# Patient Record
Sex: Female | Born: 1955
Health system: Southern US, Community
[De-identification: ages and names within clinical notes are randomized; demographics above are authoritative.]

## PROBLEM LIST (undated history)

## (undated) DIAGNOSIS — K219 Gastro-esophageal reflux disease without esophagitis: Secondary | ICD-10-CM

## (undated) DIAGNOSIS — F32A Depression, unspecified: Secondary | ICD-10-CM

## (undated) DIAGNOSIS — E785 Hyperlipidemia, unspecified: Secondary | ICD-10-CM

## (undated) DIAGNOSIS — F419 Anxiety disorder, unspecified: Secondary | ICD-10-CM

## (undated) DIAGNOSIS — T7840XA Allergy, unspecified, initial encounter: Secondary | ICD-10-CM

## (undated) DIAGNOSIS — M1990 Unspecified osteoarthritis, unspecified site: Secondary | ICD-10-CM

## (undated) DIAGNOSIS — I10 Essential (primary) hypertension: Secondary | ICD-10-CM

## (undated) DIAGNOSIS — F329 Major depressive disorder, single episode, unspecified: Secondary | ICD-10-CM

## (undated) HISTORY — DX: Gastro-esophageal reflux disease without esophagitis: K21.9

## (undated) HISTORY — DX: Anxiety disorder, unspecified: F41.9

## (undated) HISTORY — DX: Depression, unspecified: F32.A

## (undated) HISTORY — DX: Major depressive disorder, single episode, unspecified: F32.9

## (undated) HISTORY — DX: Allergy, unspecified, initial encounter: T78.40XA

---

## 1997-01-28 HISTORY — PX: ABDOMINAL HYSTERECTOMY: SHX81

## 2004-08-27 ENCOUNTER — Ambulatory Visit (HOSPITAL_COMMUNITY): Admission: RE | Admit: 2004-08-27 | Discharge: 2004-08-27 | Payer: Self-pay | Admitting: Family Medicine

## 2008-01-19 ENCOUNTER — Ambulatory Visit (HOSPITAL_COMMUNITY): Admission: RE | Admit: 2008-01-19 | Discharge: 2008-01-19 | Payer: Self-pay | Admitting: Internal Medicine

## 2009-02-21 ENCOUNTER — Ambulatory Visit (HOSPITAL_COMMUNITY): Admission: RE | Admit: 2009-02-21 | Discharge: 2009-02-21 | Payer: Self-pay | Admitting: Internal Medicine

## 2010-02-18 ENCOUNTER — Encounter: Payer: Self-pay | Admitting: Internal Medicine

## 2010-11-22 ENCOUNTER — Other Ambulatory Visit (HOSPITAL_COMMUNITY): Payer: Self-pay | Admitting: Family Medicine

## 2010-11-22 DIAGNOSIS — Z139 Encounter for screening, unspecified: Secondary | ICD-10-CM

## 2010-11-27 ENCOUNTER — Ambulatory Visit (HOSPITAL_COMMUNITY): Payer: Self-pay

## 2010-12-05 ENCOUNTER — Other Ambulatory Visit (HOSPITAL_COMMUNITY): Payer: Self-pay | Admitting: Family Medicine

## 2010-12-05 DIAGNOSIS — Z1231 Encounter for screening mammogram for malignant neoplasm of breast: Secondary | ICD-10-CM

## 2010-12-06 ENCOUNTER — Ambulatory Visit (HOSPITAL_COMMUNITY)
Admission: RE | Admit: 2010-12-06 | Discharge: 2010-12-06 | Disposition: A | Discharge status: Home / Self Care | Payer: Self-pay | Source: Ambulatory Visit | Attending: Family Medicine | Admitting: Family Medicine

## 2010-12-06 DIAGNOSIS — Z1231 Encounter for screening mammogram for malignant neoplasm of breast: Secondary | ICD-10-CM

## 2012-01-01 ENCOUNTER — Other Ambulatory Visit (HOSPITAL_COMMUNITY): Payer: Self-pay | Admitting: Family Medicine

## 2012-01-08 ENCOUNTER — Other Ambulatory Visit (HOSPITAL_COMMUNITY): Payer: Self-pay | Admitting: Family Medicine

## 2012-01-08 DIAGNOSIS — Z1231 Encounter for screening mammogram for malignant neoplasm of breast: Secondary | ICD-10-CM

## 2012-01-10 ENCOUNTER — Ambulatory Visit (HOSPITAL_COMMUNITY)
Admission: RE | Admit: 2012-01-10 | Discharge: 2012-01-10 | Disposition: A | Discharge status: Home / Self Care | Payer: Self-pay | Source: Ambulatory Visit | Attending: Family Medicine | Admitting: Family Medicine

## 2012-01-10 DIAGNOSIS — Z1231 Encounter for screening mammogram for malignant neoplasm of breast: Secondary | ICD-10-CM

## 2012-11-27 ENCOUNTER — Encounter (HOSPITAL_COMMUNITY): Payer: Self-pay | Admitting: Emergency Medicine

## 2012-11-27 ENCOUNTER — Emergency Department (HOSPITAL_COMMUNITY)
Admission: EM | Admit: 2012-11-27 | Discharge: 2012-11-28 | Disposition: A | Discharge status: Home / Self Care | Payer: Disability Insurance | Attending: Emergency Medicine | Admitting: Emergency Medicine

## 2012-11-27 DIAGNOSIS — F17200 Nicotine dependence, unspecified, uncomplicated: Secondary | ICD-10-CM | POA: Insufficient documentation

## 2012-11-27 DIAGNOSIS — F3289 Other specified depressive episodes: Secondary | ICD-10-CM | POA: Insufficient documentation

## 2012-11-27 DIAGNOSIS — Z8739 Personal history of other diseases of the musculoskeletal system and connective tissue: Secondary | ICD-10-CM | POA: Insufficient documentation

## 2012-11-27 DIAGNOSIS — Z880 Allergy status to penicillin: Secondary | ICD-10-CM | POA: Insufficient documentation

## 2012-11-27 DIAGNOSIS — I10 Essential (primary) hypertension: Secondary | ICD-10-CM | POA: Insufficient documentation

## 2012-11-27 DIAGNOSIS — E785 Hyperlipidemia, unspecified: Secondary | ICD-10-CM | POA: Insufficient documentation

## 2012-11-27 DIAGNOSIS — F329 Major depressive disorder, single episode, unspecified: Secondary | ICD-10-CM

## 2012-11-27 HISTORY — DX: Hyperlipidemia, unspecified: E78.5

## 2012-11-27 HISTORY — DX: Unspecified osteoarthritis, unspecified site: M19.90

## 2012-11-27 HISTORY — DX: Essential (primary) hypertension: I10

## 2012-11-27 NOTE — ED Notes (Addendum)
Pt brought to department by family.  Per family, pt is "going through some depression and anxiety problems." Pt also reporting chronic pain in left hip and back.  Pt has initial appointment with counselor.  Has not been started on medications.  Next appointment scheduled, but pt cannot remember who it is.

## 2012-11-28 LAB — CBC WITH DIFFERENTIAL/PLATELET
Basophils Absolute: 0 10*3/uL (ref 0.0–0.1)
Basophils Relative: 0 % (ref 0–1)
HCT: 39.1 % (ref 36.0–46.0)
Hemoglobin: 13.3 g/dL (ref 12.0–15.0)
Lymphs Abs: 2.7 10*3/uL (ref 0.7–4.0)
MCH: 28.3 pg (ref 26.0–34.0)
MCHC: 34 g/dL (ref 30.0–36.0)
Platelets: 243 10*3/uL (ref 150–400)
RBC: 4.7 MIL/uL (ref 3.87–5.11)
RDW: 13.4 % (ref 11.5–15.5)
WBC: 8.2 10*3/uL (ref 4.0–10.5)

## 2012-11-28 LAB — BASIC METABOLIC PANEL
BUN: 9 mg/dL (ref 6–23)
Creatinine, Ser: 1.01 mg/dL (ref 0.50–1.10)
GFR calc non Af Amer: 61 mL/min — ABNORMAL LOW (ref >90)
Glucose, Bld: 107 mg/dL — ABNORMAL HIGH (ref 70–99)
Sodium: 139 mEq/L (ref 135–145)

## 2012-11-28 LAB — RAPID URINE DRUG SCREEN, HOSP PERFORMED
Amphetamines: NOT DETECTED
Benzodiazepines: NOT DETECTED
Opiates: NOT DETECTED

## 2012-11-28 LAB — ETHANOL: Alcohol, Ethyl (B): 146 mg/dL — ABNORMAL HIGH (ref 0–11)

## 2012-11-28 MED ORDER — NAPROXEN 500 MG PO TABS: 500.0000 mg | ORAL_TABLET | Freq: Two times a day (BID) | ORAL | Status: DC

## 2012-11-28 MED ORDER — PREDNISONE 20 MG PO TABS: ORAL_TABLET | ORAL | Status: DC

## 2012-11-28 NOTE — ED Notes (Signed)
TTS consult completed - during evaluation, patient was very soft spoken and tearful.

## 2012-11-28 NOTE — ED Provider Notes (Addendum)
CSN: [id number]     Arrival date & time 11/27/12  2310 History   First MD Initiated Contact with Patient 11/28/12 0006     No chief complaint on file.  (Consider location/radiation/quality/duration/timing/severity/associated sxs/prior Treatment) HPI... long-standing depression for several years after her divorce. Patient says she is tired of living but not actively suicidal. She takes care of an impaired relative. Some drinking. No Street drugs. She just started some counseling this week. Severity is moderate. Nothing makes symptoms better or worse.   Past Medical History  Diagnosis Date  . Hypertension   . Hyperlipidemia   . Arthritis    Past Surgical History  Procedure Laterality Date  . Abdominal hysterectomy     History reviewed. No pertinent family history. History  Substance Use Topics  . Smoking status: Current Every Day Smoker -- 0.50 packs/day    Types: Cigarettes  . Smokeless tobacco: Not on file  . Alcohol Use: Yes     Comment: occasional   OB History   Grav Para Term Preterm Abortions TAB SAB Ect Mult Living                 Review of Systems  All other systems reviewed and are negative.    Allergies  Penicillins  Home Medications   Current Outpatient Rx  Name  Route  Sig  Dispense  Refill  . UNKNOWN TO PATIENT               . UNKNOWN TO PATIENT                BP 120/72  Pulse 91  Temp(Src) 98.3 F (36.8 C) (Oral)  Resp 16  Ht 4\' 10"  (1.473 m)  Wt 186 lb (84.369 kg)  BMI 38.88 kg/m2  SpO2 97% Physical Exam  Nursing note and vitals reviewed. Constitutional: She is oriented to person, place, and time. She appears well-developed and well-nourished.  HENT:  Head: Normocephalic and atraumatic.  Eyes: Conjunctivae and EOM are normal. Pupils are equal, round, and reactive to light.  Neck: Normal range of motion. Neck supple.  Cardiovascular: Normal rate, regular rhythm and normal heart sounds.   Pulmonary/Chest: Effort normal and breath  sounds normal.  Abdominal: Soft. Bowel sounds are normal.  Musculoskeletal: Normal range of motion.  Neurological: She is alert and oriented to person, place, and time.  Skin: Skin is warm and dry.  Psychiatric:  Flat affect, depressed    ED Course  Procedures (including critical care time) Labs Review Labs Reviewed  BASIC METABOLIC PANEL - Abnormal; Notable for the following:    Glucose, Bld 107 (*)    GFR calc non Af Amer 61 (*)    GFR calc Af Amer 71 (*)    All other components within normal limits  ETHANOL - Abnormal; Notable for the following:    Alcohol, Ethyl (B) 146 (*)    All other components within normal limits  URINE RAPID DRUG SCREEN (HOSP PERFORMED) - Abnormal; Notable for the following:    Tetrahydrocannabinol POSITIVE (*)    All other components within normal limits  CBC WITH DIFFERENTIAL   Imaging Review No results found.  EKG Interpretation   None       MDM   1. Depression    Will obtain behavioral health consultation  Addendum at 05 35..... date of consultation obtained. No suicidal or homicidal ideation. Patient has a primary care therapist. She will followup with same  Donnetta Hutching, MD 11/28/12 0419  Donnetta Hutching,  MD 11/28/12 1610
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
## 2012-11-28 NOTE — ED Notes (Signed)
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  States this and pain in her hip and back has increased her stress level and she is at the point she feels she would be better to just go to sleep and not wake up.   Remains quietly tearful, still c/o being cold.  Requested some coffee - provided with decaf.  Cousin (female) sitting in room with her.

## 2012-12-11 ENCOUNTER — Other Ambulatory Visit (HOSPITAL_COMMUNITY): Payer: Self-pay | Admitting: Family Medicine

## 2012-12-11 DIAGNOSIS — Z1231 Encounter for screening mammogram for malignant neoplasm of breast: Secondary | ICD-10-CM

## 2013-01-12 ENCOUNTER — Ambulatory Visit (HOSPITAL_COMMUNITY)
Admission: RE | Admit: 2013-01-12 | Discharge: 2013-01-12 | Disposition: A | Discharge status: Home / Self Care | Payer: Disability Insurance | Source: Ambulatory Visit | Attending: Family Medicine | Admitting: Family Medicine

## 2013-01-12 DIAGNOSIS — Z1231 Encounter for screening mammogram for malignant neoplasm of breast: Secondary | ICD-10-CM

## 2013-02-18 ENCOUNTER — Other Ambulatory Visit (HOSPITAL_COMMUNITY): Payer: Self-pay | Admitting: Family Medicine

## 2013-02-18 ENCOUNTER — Ambulatory Visit (HOSPITAL_COMMUNITY)
Admission: RE | Admit: 2013-02-18 | Discharge: 2013-02-18 | Disposition: A | Discharge status: Home / Self Care | Payer: Disability Insurance | Source: Ambulatory Visit | Attending: Family Medicine | Admitting: Family Medicine

## 2013-02-18 DIAGNOSIS — M545 Low back pain, unspecified: Secondary | ICD-10-CM

## 2013-04-12 ENCOUNTER — Other Ambulatory Visit (HOSPITAL_COMMUNITY): Payer: Self-pay | Admitting: Obstetrics and Gynecology

## 2013-04-12 ENCOUNTER — Ambulatory Visit (HOSPITAL_COMMUNITY)
Admission: RE | Admit: 2013-04-12 | Discharge: 2013-04-12 | Disposition: A | Discharge status: Home / Self Care | Payer: Disability Insurance | Source: Ambulatory Visit | Attending: Obstetrics and Gynecology | Admitting: Obstetrics and Gynecology

## 2013-04-12 DIAGNOSIS — M1990 Unspecified osteoarthritis, unspecified site: Secondary | ICD-10-CM

## 2013-04-12 DIAGNOSIS — M79605 Pain in left leg: Secondary | ICD-10-CM

## 2013-04-12 DIAGNOSIS — M259 Joint disorder, unspecified: Secondary | ICD-10-CM | POA: Insufficient documentation

## 2013-04-12 DIAGNOSIS — M25569 Pain in unspecified knee: Secondary | ICD-10-CM | POA: Insufficient documentation

## 2014-05-05 ENCOUNTER — Encounter (HOSPITAL_COMMUNITY): Payer: Self-pay | Admitting: *Deleted

## 2014-05-05 ENCOUNTER — Emergency Department (HOSPITAL_COMMUNITY): Payer: Self-pay

## 2014-05-05 ENCOUNTER — Emergency Department (HOSPITAL_COMMUNITY)
Admission: EM | Admit: 2014-05-05 | Discharge: 2014-05-06 | Disposition: A | Discharge status: Home / Self Care | Payer: Self-pay | Attending: Emergency Medicine | Admitting: Emergency Medicine

## 2014-05-05 DIAGNOSIS — S93402A Sprain of unspecified ligament of left ankle, initial encounter: Secondary | ICD-10-CM | POA: Insufficient documentation

## 2014-05-05 DIAGNOSIS — Z8739 Personal history of other diseases of the musculoskeletal system and connective tissue: Secondary | ICD-10-CM | POA: Insufficient documentation

## 2014-05-05 DIAGNOSIS — Z720 Tobacco use: Secondary | ICD-10-CM | POA: Insufficient documentation

## 2014-05-05 DIAGNOSIS — Y92009 Unspecified place in unspecified non-institutional (private) residence as the place of occurrence of the external cause: Secondary | ICD-10-CM

## 2014-05-05 DIAGNOSIS — S8001XA Contusion of right knee, initial encounter: Secondary | ICD-10-CM | POA: Insufficient documentation

## 2014-05-05 DIAGNOSIS — Z791 Long term (current) use of non-steroidal anti-inflammatories (NSAID): Secondary | ICD-10-CM | POA: Insufficient documentation

## 2014-05-05 DIAGNOSIS — Z7952 Long term (current) use of systemic steroids: Secondary | ICD-10-CM | POA: Insufficient documentation

## 2014-05-05 DIAGNOSIS — Y9301 Activity, walking, marching and hiking: Secondary | ICD-10-CM | POA: Insufficient documentation

## 2014-05-05 DIAGNOSIS — W19XXXA Unspecified fall, initial encounter: Secondary | ICD-10-CM

## 2014-05-05 DIAGNOSIS — Y9289 Other specified places as the place of occurrence of the external cause: Secondary | ICD-10-CM | POA: Insufficient documentation

## 2014-05-05 DIAGNOSIS — Y998 Other external cause status: Secondary | ICD-10-CM | POA: Insufficient documentation

## 2014-05-05 DIAGNOSIS — W1830XA Fall on same level, unspecified, initial encounter: Secondary | ICD-10-CM | POA: Insufficient documentation

## 2014-05-05 DIAGNOSIS — I10 Essential (primary) hypertension: Secondary | ICD-10-CM | POA: Insufficient documentation

## 2014-05-05 DIAGNOSIS — Z8639 Personal history of other endocrine, nutritional and metabolic disease: Secondary | ICD-10-CM | POA: Insufficient documentation

## 2014-05-05 DIAGNOSIS — F419 Anxiety disorder, unspecified: Secondary | ICD-10-CM | POA: Insufficient documentation

## 2014-05-05 DIAGNOSIS — Z880 Allergy status to penicillin: Secondary | ICD-10-CM | POA: Insufficient documentation

## 2014-05-05 MED ORDER — IBUPROFEN 800 MG PO TABS
800.0000 mg | ORAL_TABLET | Freq: Once | ORAL | Status: AC
  Administered 2014-05-06: 800 mg via ORAL
  Filled 2014-05-05: qty 1

## 2014-05-05 NOTE — ED Provider Notes (Signed)
CSN: [id number]     Arrival date & time 05/05/14  2152 History   First MD Initiated Contact with Patient 05/05/14 2250     Chief Complaint  Patient presents with  . Extremity Pain     (Consider location/radiation/quality/duration/timing/severity/associated sxs/prior Treatment) HPI   Victoria Graves is a 59 y.o. female who presents to the Emergency Department complaining of left ankle pain and right knee pain.  Se states she was walking to her car and twisted her left ankle which caused her to fall onto her right knee.  She reports pain and swelling to the anterior left ankle and entire right knee.  Pain is worse with weight bearing.  She reports the injury occurred approximately two hours ago.  She has not tried any therapies or medications prior to arrival.  She reports feeling anxious and states that she takes "nerve pills" which she has not taken tonight.  She denies numbness, weakness of the lower extremities, neck or back pain, head injury or LOC.     Past Medical History  Diagnosis Date  . Hypertension   . Hyperlipidemia   . Arthritis    Past Surgical History  Procedure Laterality Date  . Abdominal hysterectomy     History reviewed. No pertinent family history. History  Substance Use Topics  . Smoking status: Current Every Day Smoker -- 0.50 packs/day    Types: Cigarettes  . Smokeless tobacco: Not on file  . Alcohol Use: Yes     Comment: occasional   OB History    No data available     Review of Systems  Constitutional: Negative for fever and chills.  Gastrointestinal: Negative for nausea and vomiting.  Genitourinary: Negative for dysuria and difficulty urinating.  Musculoskeletal: Positive for joint swelling and arthralgias (left ankle and right knee pain). Negative for back pain and neck pain.  Skin: Negative for color change and wound.  Neurological: Negative for dizziness, weakness and numbness.  Psychiatric/Behavioral: Negative for confusion. The patient is  nervous/anxious.   All other systems reviewed and are negative.     Allergies  Penicillins  Home Medications   Prior to Admission medications   Medication Sig Start Date End Date Taking? Authorizing Provider  naproxen (NAPROSYN) 500 MG tablet Take 1 tablet (500 mg total) by mouth 2 (two) times daily. 11/28/12   Nat Christen, MD  predniSONE (DELTASONE) 20 MG tablet 3 tabs po day one, then 2 po daily x 4 days 11/28/12   Nat Christen, MD  UNKNOWN TO PATIENT     Historical Provider, MD  UNKNOWN TO PATIENT     Historical Provider, MD   BP 143/58 mmHg  Pulse 83  Temp(Src) 99 F (37.2 C) (Oral)  Ht 4\' 10"  (1.473 m)  Wt 151 lb (68.493 kg)  BMI 31.57 kg/m2  SpO2 99% Physical Exam  Constitutional: She is oriented to person, place, and time. She appears well-developed and well-nourished. No distress.  I do not smell ETOH  HENT:  Head: Normocephalic and atraumatic.  Neck: Normal range of motion. Neck supple. No spinous process tenderness and no muscular tenderness present.  Cardiovascular: Normal rate, regular rhythm, normal heart sounds and intact distal pulses.   No murmur heard. Pulmonary/Chest: Effort normal and breath sounds normal. No respiratory distress.  Musculoskeletal: She exhibits edema and tenderness.  Mild edema and tenderness of the dorsal left ankle.  ROM is preserved.  DP pulse is brisk,distal sensation intact.  No erythema, abrasion, bruising or bony deformity.  No proximal  tenderness. Pt also has diffuse tenderness over the right patella.  Mild patellar crepitus.  No bony deformity.  No effusion or edema  Neurological: She is alert and oriented to person, place, and time. She exhibits normal muscle tone. Coordination normal.  Skin: Skin is warm and dry.  Psychiatric:  Patient is tearful, anxious appearing.    Nursing note and vitals reviewed.   ED Course  Procedures (including critical care time) Labs Review Labs Reviewed - No data to display  Imaging Review Dg  Ankle Complete Left  05/05/2014   CLINICAL DATA:  Patient fell today landing on left ankle and right knee. Pain.  EXAM: LEFT ANKLE COMPLETE - 3+ VIEW  COMPARISON:  None.  FINDINGS: Old ununited ossicle over the cuboidal bone. Prominent plantar calcaneal spur. No evidence of acute fracture or subluxation. No focal bone lesion or bone destruction. Bone cortex and trabecular architecture appear intact. No radiopaque soft tissue foreign bodies.  IMPRESSION: Negative.   Electronically Signed   By: Lucienne Capers M.D.   On: 05/05/2014 23:18   Dg Knee Complete 4 Views Right  05/05/2014   CLINICAL DATA:  Patient fell today, landing on the left ankle and right knee. Pain.  EXAM: RIGHT KNEE - COMPLETE 4+ VIEW  COMPARISON:  None.  FINDINGS: There is no evidence of fracture, dislocation, or joint effusion. There is no evidence of arthropathy or other focal bone abnormality. Soft tissues are unremarkable.  IMPRESSION: Negative.   Electronically Signed   By: Lucienne Capers M.D.   On: 05/05/2014 23:08     EKG Interpretation None      MDM   Final diagnoses:  Fall at home   Pt is anxious appearing.  Family members also at bedside.  XR results reviewed and discussed.  Likely sprains.  Patient agrees to elevate, ice and close orthopedic f/u in one week if needed.    ACE wrap and ASO applied, pain improved, remains NV intact.    Pt reviewed on the [place] narcotics database.  No recent Rx filed.  Pt appears stable for d/c   Victoria Sissi Padia, PA-C 05/06/14 [place], DO 05/06/14 [id number]

## 2014-05-05 NOTE — ED Notes (Signed)
Pt slipped and fell on the grass with her bedroom shoes on. Pt c/o left ankle pain and right knee pain. Etoh?

## 2014-05-06 MED ORDER — HYDROCODONE-ACETAMINOPHEN 5-325 MG PO TABS
ORAL_TABLET | ORAL | Status: DC
Start: 2014-05-06 — End: 2015-11-01

## 2014-05-06 MED ORDER — BACITRACIN ZINC 500 UNIT/GM EX OINT
TOPICAL_OINTMENT | CUTANEOUS | Status: AC
  Filled 2014-05-06: qty 0.9

## 2014-05-06 MED ORDER — IBUPROFEN 800 MG PO TABS: 800.0000 mg | ORAL_TABLET | Freq: Three times a day (TID) | ORAL | Status: DC

## 2014-05-06 NOTE — Discharge Instructions (Signed)
Ankle Sprain  An ankle sprain is an injury to the strong, fibrous tissues (ligaments) that hold your ankle bones together.   HOME CARE   · Put ice on your ankle for 1-2 days or as told by your doctor.  ¨ Put ice in a plastic bag.  ¨ Place a towel between your skin and the bag.  ¨ Leave the ice on for 15-20 minutes at a time, every 2 hours while you are awake.  · Only take medicine as told by your doctor.  · Raise (elevate) your injured ankle above the level of your heart as much as possible for 2-3 days.  · Use crutches if your doctor tells you to. Slowly put your own weight on the affected ankle. Use the crutches until you can walk without pain.  · If you have a plaster splint:  ¨ Do not rest it on anything harder than a pillow for 24 hours.  ¨ Do not put weight on it.  ¨ Do not get it wet.  ¨ Take it off to shower or bathe.  · If given, use an elastic wrap or support stocking for support. Take the wrap off if your toes lose feeling (numb), tingle, or turn cold or blue.  · If you have an air splint:  ¨ Add or let out air to make it comfortable.  ¨ Take it off at night and to shower and bathe.  ¨ Wiggle your toes and move your ankle up and down often while you are wearing it.  GET HELP IF:  · You have rapidly increasing bruising or puffiness (swelling).  · Your toes feel very cold.  · You lose feeling in your foot.  · Your medicine does not help your pain.  GET HELP RIGHT AWAY IF:   · Your toes lose feeling (numb) or turn blue.  · You have severe pain that is increasing.  MAKE SURE YOU:   · Understand these instructions.  · Will watch your condition.  · Will get help right away if you are not doing well or get worse.  Document Released: 07/03/2007 Document Revised: 05/31/2013 Document Reviewed: 07/29/2011  [employer]® Patient Information ©2015 [employer]. This information is not intended to replace advice given to you by your health care provider. Make sure you discuss any questions you have with your health care  provider.

## 2014-05-10 MED FILL — Hydrocodone-Acetaminophen Tab 5-325 MG: ORAL | Qty: 6 | Status: AC

## 2015-11-01 ENCOUNTER — Encounter: Payer: Self-pay | Admitting: Family Medicine

## 2015-11-01 ENCOUNTER — Ambulatory Visit (INDEPENDENT_AMBULATORY_CARE_PROVIDER_SITE_OTHER): Payer: Medicare Other | Admitting: Family Medicine

## 2015-11-01 ENCOUNTER — Encounter (INDEPENDENT_AMBULATORY_CARE_PROVIDER_SITE_OTHER): Payer: Self-pay

## 2015-11-01 VITALS — BP 130/72 | HR 72 | Resp 18 | Ht <= 58 in | Wt 183.0 lb

## 2015-11-01 DIAGNOSIS — M47816 Spondylosis without myelopathy or radiculopathy, lumbar region: Secondary | ICD-10-CM | POA: Insufficient documentation

## 2015-11-01 DIAGNOSIS — E785 Hyperlipidemia, unspecified: Secondary | ICD-10-CM | POA: Insufficient documentation

## 2015-11-01 DIAGNOSIS — I10 Essential (primary) hypertension: Secondary | ICD-10-CM | POA: Insufficient documentation

## 2015-11-01 DIAGNOSIS — Z7689 Persons encountering health services in other specified circumstances: Secondary | ICD-10-CM | POA: Diagnosis not present

## 2015-11-01 DIAGNOSIS — F329 Major depressive disorder, single episode, unspecified: Secondary | ICD-10-CM | POA: Insufficient documentation

## 2015-11-01 DIAGNOSIS — M1612 Unilateral primary osteoarthritis, left hip: Secondary | ICD-10-CM | POA: Insufficient documentation

## 2015-11-01 DIAGNOSIS — Z2821 Immunization not carried out because of patient refusal: Secondary | ICD-10-CM

## 2015-11-01 DIAGNOSIS — E784 Other hyperlipidemia: Secondary | ICD-10-CM

## 2015-11-01 DIAGNOSIS — E7849 Other hyperlipidemia: Secondary | ICD-10-CM

## 2015-11-01 DIAGNOSIS — F32A Depression, unspecified: Secondary | ICD-10-CM | POA: Insufficient documentation

## 2015-11-01 DIAGNOSIS — Z720 Tobacco use: Secondary | ICD-10-CM | POA: Diagnosis not present

## 2015-11-01 MED ORDER — LOSARTAN POTASSIUM-HCTZ 50-12.5 MG PO TABS: 1.0000 | ORAL_TABLET | Freq: Every day | ORAL | 11 refills | Status: DC

## 2015-11-01 MED ORDER — FLUOXETINE HCL 20 MG PO CAPS: 20.0000 mg | ORAL_CAPSULE | Freq: Every day | ORAL | 11 refills | Status: DC

## 2015-11-01 NOTE — Patient Instructions (Signed)
Request records Dr Lorriane Shire  Stop the lisinopril / HCTZ Start the losartan / HCTZ This should take care of the cough  Start the fluoxetine 20 mg daily This will start to work in a couple of weeks Call for problems  Need to come back for a PE in 4 weeks (40 min)  Consider a flu shot

## 2015-11-01 NOTE — Progress Notes (Signed)
Chief Complaint  Patient presents with  . Establish Care    previously with Dr. Cindie Laroche    Patient is here to establish with this practice as her new primary care. Records requested from primary care. She states she has never had a colonoscopy. She states is significant recommended to her. She states she hasn't had a mammogram in several years. Her mammograms in the past for negative. She had a hysterectomy in the late 1990s for fibroids. She refuses immunizations. The importance of flu shots are reviewed and shots are recommended. Patient continues to refuse. She states she's had a tetanus in 3 or 4 years ago. Patient doesn't exercise. She is overweight. She cites arthritis in her back and hip is the main reason she doesn't exercise. Water exercises recommended to her today. She has well-controlled hypertension. She has a cough from her lisinopril. I will switch her today to losartan to see if this can be improved. She denies any complications from hypertension, any heart disease stroke or vascular disease. Patient has hyperlipidemia. She is compliant with pravastatin. She is noncompliant with a low cholesterol diet. Low-cholesterol foods are reviewed with her. Patient has a history of depression. She is previously on fluoxetine. Her previous primary care doctor stop the fluoxetine and told her she didn't need it anymore. She has become more anxious, easily upset, and tearful since going off of this medication and is here today requesting to be put back on her antidepressant. No thoughts of harming self or others. The patient smokes cigarettes.I have discussed the multiple health risks associated with cigarette smoking including, but not limited to, cardiovascular disease, lung disease and cancer.  I have strongly recommended that smoking be stopped.  I have reviewed the various methods of quitting including cold [place], classes, nicotine replacements and prescription medications.  I have  offered assistance in this difficult process.  The patient is not interested in assistance at this time. She thinks that she only smokes one pack a week she is a low risk. We will revisit this at her next visit.   Patient Active Problem List   Diagnosis Date Noted  . Tobacco abuse 11/01/2015  . Depression 11/01/2015  . HTN (hypertension) 11/01/2015  . HLD (hyperlipidemia) 11/01/2015  . Degenerative joint disease (DJD) of lumbar spine 11/01/2015  . Osteoarthritis of left hip 11/01/2015  . Immunization refused 11/01/2015    Outpatient Encounter Prescriptions as of 11/01/2015  Medication Sig  . BLACK COHOSH EXTRACT PO Take by mouth.  . naproxen (NAPROSYN) 500 MG tablet Take 1 tablet (500 mg total) by mouth 2 (two) times daily.  . pravastatin (PRAVACHOL) 40 MG tablet Take 40 mg by mouth daily.  Marland Kitchen FLUoxetine (PROZAC) 20 MG capsule Take 1 capsule (20 mg total) by mouth daily.  Marland Kitchen losartan-hydrochlorothiazide (HYZAAR) 50-12.5 MG tablet Take 1 tablet by mouth daily.   No facility-administered encounter medications on file as of 11/01/2015.     Past Medical History:  Diagnosis Date  . Allergy   . Anxiety   . Arthritis   . Depression   . GERD (gastroesophageal reflux disease)   . Hyperlipidemia   . Hypertension     Past Surgical History:  Procedure Laterality Date  . ABDOMINAL HYSTERECTOMY  1999   fibroid    Social History   Social History  . Marital status: Divorced    Spouse name: N/A  . Number of children: 0  . Years of education: 10   Occupational History  . disability  back and hip   Social History Main Topics  . Smoking status: Current Every Day Smoker    Packs/day: 0.10    Types: Cigarettes  . Smokeless tobacco: Never Used  . Alcohol use Yes     Comment: occasional  . Drug use: No  . Sexual activity: Yes    Birth control/ protection: Surgical   Other Topics Concern  . Not on file   Social History Narrative   Lives with friend and cousin who has mental  disability    Family History  Problem Relation Age of Onset  . Hypertension Mother   . Arthritis Mother   . Heart disease Maternal Grandmother   . Kidney disease Maternal Grandmother     Review of Systems  Constitutional: Negative for chills, fever and weight loss.  HENT: Negative for congestion and hearing loss.   Eyes: Negative for blurred vision and pain.  Respiratory: Positive for cough. Negative for shortness of breath.        Chronic dry cough  Cardiovascular: Negative for chest pain and leg swelling.  Gastrointestinal: Negative for abdominal pain, constipation, diarrhea and heartburn.  Genitourinary: Negative for dysuria and frequency.  Musculoskeletal: Negative for falls, joint pain and myalgias.  Neurological: Negative for dizziness, seizures and headaches.  Psychiatric/Behavioral: Positive for depression. Negative for suicidal ideas. The patient is nervous/anxious. The patient does not have insomnia.     BP 130/72   Pulse 72   Resp 18   Ht 4' 9.5" (1.461 m)   Wt 183 lb (83 kg)   SpO2 98%   BMI 38.92 kg/m   Physical Exam  Constitutional: She is oriented to person, place, and time. She appears well-developed and well-nourished.  HENT:  Head: Normocephalic and atraumatic.  Right Ear: External ear normal.  Left Ear: External ear normal.  Mouth/Throat: Oropharynx is clear and moist.  Teeth in good repair  Eyes: Conjunctivae are normal. Pupils are equal, round, and reactive to light.  Due for eye exam  Neck: Normal range of motion. Neck supple. No thyromegaly present.  Cardiovascular: Normal rate, regular rhythm and normal heart sounds.   Pulmonary/Chest: Effort normal and breath sounds normal. No respiratory distress.  Abdominal: Soft. Bowel sounds are normal.  Musculoskeletal: Normal range of motion. She exhibits no edema.  Lymphadenopathy:    She has no cervical adenopathy.  Neurological: She is alert and oriented to person, place, and time. She has normal  reflexes.  Gait mildly antalgic. Valgus knees  Skin: Skin is warm and dry.  Psychiatric: She has a normal mood and affect. Her behavior is normal. Thought content normal.  Nursing note and vitals reviewed.  ASSESSMENT/PLAN:   1. Tobacco abuse Discussed quitting  2. Essential hypertension Change lisinopril to losartan  3. Other hyperlipidemia Continue pravastatin. Obtain old records for recent lipid testing  4. Primary osteoarthritis of left hip Loss and water exercises recommended  5. Encounter to establish care with new doctor   6. Immunization refused Recommended flu shot   Patient Instructions  Request records Dr Lorriane Shire  Stop the lisinopril / HCTZ Start the losartan / HCTZ This should take care of the cough  Start the fluoxetine 20 mg daily This will start to work in a couple of weeks Call for problems  Need to come back for a PE in 4 weeks (40 min)  Consider a flu shot      Raylene Everts, MD

## 2015-11-29 ENCOUNTER — Encounter: Payer: Self-pay | Admitting: Family Medicine

## 2015-11-29 ENCOUNTER — Telehealth: Payer: Self-pay | Admitting: Family Medicine

## 2015-11-29 ENCOUNTER — Ambulatory Visit (INDEPENDENT_AMBULATORY_CARE_PROVIDER_SITE_OTHER): Payer: Medicare Other | Admitting: Family Medicine

## 2015-11-29 VITALS — BP 136/84 | HR 72 | Temp 99.0°F | Resp 16 | Ht <= 58 in | Wt 182.1 lb

## 2015-11-29 DIAGNOSIS — I10 Essential (primary) hypertension: Secondary | ICD-10-CM | POA: Diagnosis not present

## 2015-11-29 DIAGNOSIS — Z1211 Encounter for screening for malignant neoplasm of colon: Secondary | ICD-10-CM | POA: Diagnosis not present

## 2015-11-29 DIAGNOSIS — Z1159 Encounter for screening for other viral diseases: Secondary | ICD-10-CM

## 2015-11-29 DIAGNOSIS — Z1239 Encounter for other screening for malignant neoplasm of breast: Secondary | ICD-10-CM

## 2015-11-29 DIAGNOSIS — E784 Other hyperlipidemia: Secondary | ICD-10-CM | POA: Diagnosis not present

## 2015-11-29 DIAGNOSIS — Z0000 Encounter for general adult medical examination without abnormal findings: Secondary | ICD-10-CM | POA: Diagnosis not present

## 2015-11-29 DIAGNOSIS — Z23 Encounter for immunization: Secondary | ICD-10-CM

## 2015-11-29 DIAGNOSIS — Z1231 Encounter for screening mammogram for malignant neoplasm of breast: Secondary | ICD-10-CM

## 2015-11-29 DIAGNOSIS — E7849 Other hyperlipidemia: Secondary | ICD-10-CM

## 2015-11-29 DIAGNOSIS — L84 Corns and callosities: Secondary | ICD-10-CM

## 2015-11-29 LAB — CBC
HCT: 38.4 % (ref 35.0–45.0)
HEMOGLOBIN: 12.9 g/dL (ref 11.7–15.5)
MCH: 28.9 pg (ref 27.0–33.0)
MCHC: 33.6 g/dL (ref 32.0–36.0)
MCV: 85.9 fL (ref 80.0–100.0)
MPV: 10 fL (ref 7.5–12.5)
PLATELETS: 242 10*3/uL (ref 140–400)
RBC: 4.47 MIL/uL (ref 3.80–5.10)
RDW: 13.9 % (ref 11.0–15.0)
WBC: 4.9 10*3/uL (ref 3.8–10.8)

## 2015-11-29 NOTE — Progress Notes (Signed)
Chief Complaint  Patient presents with  . Annual Exam  Patient is here for an annual exam. She is given a tetanus shot and her flu shot today. She is due for mammogram and this is ordered today. She has never had a colonoscopy and this is also ordered today. She is due for some screening labs for hypertension and hyperlipidemia, these are done today We discussed diet and exercise to better control her weight and her medical conditions I have discussed the multiple health risks associated with cigarette smoking including, but not limited to, cardiovascular disease, lung disease and cancer.  I have strongly recommended that smoking be stopped.  I have reviewed the various methods of quitting including cold [place], classes, nicotine replacements and prescription medications.  I have offered assistance in this difficult process.  The patient is not interested in assistance at this time.    Patient Active Problem List   Diagnosis Date Noted  . Tobacco abuse 11/01/2015  . Depression 11/01/2015  . HTN (hypertension) 11/01/2015  . HLD (hyperlipidemia) 11/01/2015  . Degenerative joint disease (DJD) of lumbar spine 11/01/2015  . Osteoarthritis of left hip 11/01/2015  . Immunization refused 11/01/2015    Outpatient Encounter Prescriptions as of 11/29/2015  Medication Sig  . BLACK COHOSH EXTRACT PO Take by mouth.  Marland Kitchen FLUoxetine (PROZAC) 20 MG capsule Take 1 capsule (20 mg total) by mouth daily.  Marland Kitchen losartan-hydrochlorothiazide (HYZAAR) 50-12.5 MG tablet Take 1 tablet by mouth daily.  . naproxen (NAPROSYN) 500 MG tablet Take 1 tablet (500 mg total) by mouth 2 (two) times daily.  . pravastatin (PRAVACHOL) 40 MG tablet Take 40 mg by mouth daily.   No facility-administered encounter medications on file as of 11/29/2015.     Past Medical History:  Diagnosis Date  . Allergy   . Anxiety   . Arthritis   . Depression   . GERD (gastroesophageal reflux disease)   . Hyperlipidemia   . Hypertension      Past Surgical History:  Procedure Laterality Date  . ABDOMINAL HYSTERECTOMY  1999   fibroid    Social History   Social History  . Marital status: Divorced    Spouse name: N/A  . Number of children: 0  . Years of education: 10   Occupational History  . disability     back and hip   Social History Main Topics  . Smoking status: Current Every Day Smoker    Packs/day: 0.10    Types: Cigarettes  . Smokeless tobacco: Never Used  . Alcohol use Yes     Comment: occasional  . Drug use: No  . Sexual activity: Yes    Birth control/ protection: Surgical   Other Topics Concern  . Not on file   Social History Narrative   Lives with friend and cousin who has mental disability    Family History  Problem Relation Age of Onset  . Hypertension Mother   . Arthritis Mother   . Heart disease Maternal Grandmother   . Kidney disease Maternal Grandmother     Review of Systems  Constitutional: Negative for chills, fever and weight loss.  HENT: Negative for congestion and hearing loss.   Eyes: Negative for blurred vision and pain.  Respiratory: Negative for cough and shortness of breath.   Cardiovascular: Negative for chest pain and leg swelling.  Gastrointestinal: Negative for abdominal pain, constipation, diarrhea and heartburn.  Genitourinary: Negative for dysuria and frequency.  Musculoskeletal: Negative for falls, joint pain and myalgias.  Neurological: Negative for dizziness, seizures and headaches.  Psychiatric/Behavioral: Negative for depression. The patient is not nervous/anxious and does not have insomnia.     BP 136/84 (BP Location: Left Arm, Patient Position: Sitting, Cuff Size: Normal)   Pulse 72   Temp 99 F (37.2 C) (Oral)   Resp 16   Ht 4\' 10"  (1.473 m)   Wt 182 lb 1.3 oz (82.6 kg)   BMI 38.05 kg/m   Physical Exam BP 136/84 (BP Location: Left Arm, Patient Position: Sitting, Cuff Size: Normal)   Pulse 72   Temp 99 F (37.2 C) (Oral)   Resp 16   Ht  4\' 10"  (1.473 m)   Wt 182 lb 1.3 oz (82.6 kg)   BMI 38.05 kg/m   General Appearance:    Alert, cooperative, no distress, appears stated age.Obese. Walks with normal gait   Head:    Normocephalic, without obvious abnormality, atraumatic  Eyes:    PERRL, conjunctiva/corneas clear, EOM's intact, fundi    benign, both eyes  Ears:    Normal TM's and external ear canals, both ears  Nose:   Nares normal, septum midline, mucosa normal, no drainage    or sinus tenderness  Throat:   Lips, mucosa, and tongue normal; teeth and gums normal. Teeth in good repair   Neck:   Supple, symmetrical, trachea midline, no adenopathy;    thyroid:  no enlargement/tenderness/nodules; no carotid   bruit  Back:     Symmetric, no curvature, ROM normal, no CVA tenderness  Lungs:     Clear to auscultation bilaterally, respirations unlabored  Chest Wall:    No tenderness or deformity   Heart:    Regular rate and rhythm, S1 and S2 normal, no murmur, rub   or gallop  Breast Exam:    No tenderness, masses, or nipple abnormality  Abdomen:     Soft, non-tender, bowel sounds active all four quadrants,    no masses, no organomegaly  Extremities:   Extremities normal, atraumatic, no cyanosis or edema  Pulses:   2+ and symmetric all extremities  Skin:   Skin color, texture, turgor normal, no rashes or lesions. Patient has painful corns on her feet and requests podiatry referral   Lymph nodes:   Cervical, supraclavicular, and axillary nodes normal  Neurologic:   CNII-XII intact, normal strength, sensation and reflexes    throughout  ASSESSMENT/PLAN:   1. Physical exam, annual No abnormal findings  2. Essential hypertension Blood pressure well controlled - CBC - Comprehensive metabolic panel - Urinalysis, Routine w reflex microscopic (not at [facility]) - VITAMIN D 25 Hydroxy (Vit-D Deficiency, Fractures)  3. Other hyperlipidemia Applied with pravastatin. We'll check lipid panel - Lipid panel  4. Screen for colon  cancer Ordered - Ambulatory referral to Gastroenterology  5. Screening for breast cancer Ordered - MM Digital Screening; Future  6. Need for hepatitis C screening test Ordered - Hepatitis C antibody   Patient Instructions  I have ordered a colonoscopy I have ordered a mammogram Podiatry referral for painful corns Get labs done today Try to walk every day that you are able Eat heart healthy diet  See me in 2-3 months   Raylene Everts, MD

## 2015-11-29 NOTE — Patient Instructions (Addendum)
I have ordered a colonoscopy I have ordered a mammogram Podiatry referral for painful corns Get labs done today Try to walk every day that you are able Eat heart healthy diet  See me in 2-3 months

## 2015-11-30 ENCOUNTER — Encounter: Payer: Self-pay | Admitting: Family Medicine

## 2015-11-30 LAB — COMPREHENSIVE METABOLIC PANEL
ALBUMIN: 4 g/dL (ref 3.6–5.1)
ALT: 11 U/L (ref 6–29)
AST: 15 U/L (ref 10–35)
Alkaline Phosphatase: 54 U/L (ref 33–130)
BILIRUBIN TOTAL: 0.4 mg/dL (ref 0.2–1.2)
BUN: 14 mg/dL (ref 7–25)
CO2: 25 mmol/L (ref 20–31)
CREATININE: 0.81 mg/dL (ref 0.50–1.05)
Calcium: 9.5 mg/dL (ref 8.6–10.4)
Chloride: 109 mmol/L (ref 98–110)
Glucose, Bld: 100 mg/dL — ABNORMAL HIGH (ref 65–99)
Potassium: 4.2 mmol/L (ref 3.5–5.3)
SODIUM: 141 mmol/L (ref 135–146)
TOTAL PROTEIN: 6.4 g/dL (ref 6.1–8.1)

## 2015-11-30 LAB — URINALYSIS, ROUTINE W REFLEX MICROSCOPIC
Bilirubin Urine: NEGATIVE
GLUCOSE, UA: NEGATIVE
HGB URINE DIPSTICK: NEGATIVE
Ketones, ur: NEGATIVE
LEUKOCYTES UA: NEGATIVE
Nitrite: NEGATIVE
PH: 6 (ref 5.0–8.0)
PROTEIN: NEGATIVE
Specific Gravity, Urine: 1.018 (ref 1.001–1.035)

## 2015-11-30 LAB — HEPATITIS C ANTIBODY: HCV Ab: NEGATIVE

## 2015-11-30 LAB — LIPID PANEL
CHOLESTEROL: 147 mg/dL (ref 125–200)
HDL: 81 mg/dL (ref >=46)
LDL Cholesterol: 52 mg/dL (ref <130)
TRIGLYCERIDES: 71 mg/dL (ref <150)
Total CHOL/HDL Ratio: 1.8 Ratio (ref <=5.0)
VLDL: 14 mg/dL (ref <30)

## 2015-11-30 LAB — VITAMIN D 25 HYDROXY (VIT D DEFICIENCY, FRACTURES): Vit D, 25-Hydroxy: 18 ng/mL — ABNORMAL LOW (ref 30–100)

## 2015-11-30 MED ORDER — VITAMIN D (ERGOCALCIFEROL) 1.25 MG (50000 UNIT) PO CAPS: 50000.0000 [IU] | ORAL_CAPSULE | ORAL | 0 refills | Status: DC

## 2015-11-30 NOTE — Telephone Encounter (Signed)
Letter sent to pt

## 2015-12-28 ENCOUNTER — Other Ambulatory Visit: Payer: Self-pay

## 2015-12-28 MED ORDER — PRAVASTATIN SODIUM 40 MG PO TABS: 40.0000 mg | ORAL_TABLET | Freq: Every day | ORAL | 11 refills | Status: DC

## 2016-01-31 ENCOUNTER — Ambulatory Visit (INDEPENDENT_AMBULATORY_CARE_PROVIDER_SITE_OTHER): Payer: Medicare Other | Admitting: Family Medicine

## 2016-01-31 ENCOUNTER — Encounter: Payer: Self-pay | Admitting: Family Medicine

## 2016-01-31 VITALS — BP 134/70 | HR 76 | Temp 98.6°F | Resp 18 | Ht <= 58 in | Wt 184.0 lb

## 2016-01-31 DIAGNOSIS — I10 Essential (primary) hypertension: Secondary | ICD-10-CM | POA: Diagnosis not present

## 2016-01-31 DIAGNOSIS — M7062 Trochanteric bursitis, left hip: Secondary | ICD-10-CM | POA: Diagnosis not present

## 2016-01-31 DIAGNOSIS — Z720 Tobacco use: Secondary | ICD-10-CM

## 2016-01-31 DIAGNOSIS — E559 Vitamin D deficiency, unspecified: Secondary | ICD-10-CM | POA: Diagnosis not present

## 2016-01-31 DIAGNOSIS — Z6838 Body mass index (BMI) 38.0-38.9, adult: Secondary | ICD-10-CM

## 2016-01-31 DIAGNOSIS — IMO0001 Reserved for inherently not codable concepts without codable children: Secondary | ICD-10-CM

## 2016-01-31 DIAGNOSIS — E6609 Other obesity due to excess calories: Secondary | ICD-10-CM

## 2016-01-31 MED ORDER — FLUOXETINE HCL 20 MG PO CAPS: 20.0000 mg | ORAL_CAPSULE | Freq: Every day | ORAL | 3 refills | Status: DC

## 2016-01-31 NOTE — Progress Notes (Addendum)
Chief Complaint  Patient presents with  . Follow-up    2 month   Compliant with BP medicine and her pressure is controlled Has L hip pain, chronic, but flared up recently due to moving.  Discussed ice and naproxen for flare up and offered referral to orhto 0 declined Has not had her mammo or colo - too busy.  Is reminded to do this spring. Weigh is up a couple of pounds .  She is frustrated and wants "diet pill".  Offered a referral to nutrition for counseling.   Sh eis taking her vitamin D as prescribed. Is on fluoxetine for anxiety and irritability - dysthymia.  It is helping without side effects. Has cut down on smoking and now does not smoke every day , just occasionally.  Is encouraged to give them up.  Patient Active Problem List   Diagnosis Date Noted  . Vitamin D deficiency 01/31/2016  . Tobacco abuse 11/01/2015  . Depression 11/01/2015  . HTN (hypertension) 11/01/2015  . HLD (hyperlipidemia) 11/01/2015  . Degenerative joint disease (DJD) of lumbar spine 11/01/2015  . Osteoarthritis of left hip 11/01/2015  . Immunization refused 11/01/2015    Outpatient Encounter Prescriptions as of 01/31/2016  Medication Sig  . BLACK COHOSH EXTRACT PO Take by mouth.  Marland Kitchen FLUoxetine (PROZAC) 20 MG capsule Take 1 capsule (20 mg total) by mouth daily.  Marland Kitchen losartan-hydrochlorothiazide (HYZAAR) 50-12.5 MG tablet Take 1 tablet by mouth daily.  . naproxen (NAPROSYN) 500 MG tablet Take 1 tablet (500 mg total) by mouth 2 (two) times daily.  . pravastatin (PRAVACHOL) 40 MG tablet Take 1 tablet (40 mg total) by mouth daily.  . Vitamin D, Ergocalciferol, (DRISDOL) 50000 units CAPS capsule Take 1 capsule (50,000 Units total) by mouth every 7 (seven) days.   No facility-administered encounter medications on file as of 01/31/2016.     Allergies  Allergen Reactions  . Lisinopril Cough  . Penicillins Rash    Review of Systems  Constitutional: Negative for activity change, appetite change and  unexpected weight change.  HENT: Negative for dental problem and hearing loss.   Eyes: Negative for photophobia and visual disturbance.  Respiratory: Negative for cough and shortness of breath.   Cardiovascular: Negative for chest pain, palpitations and leg swelling.  Gastrointestinal: Negative for blood in stool, constipation and diarrhea.  Genitourinary: Negative for frequency.  Musculoskeletal: Positive for arthralgias and gait problem.       Chronic  Neurological: Negative for dizziness and headaches.  Psychiatric/Behavioral: Negative for dysphoric mood. The patient is not nervous/anxious.        Feels well on med    BP 134/70 (BP Location: Right Arm, Patient Position: Sitting, Cuff Size: Normal)   Pulse 76   Temp 98.6 F (37 C) (Oral)   Resp 18   Ht 4\' 10"  (1.473 m)   Wt 184 lb 0.6 oz (83.5 kg)   SpO2 96%   BMI 38.46 kg/m   Physical Exam  Constitutional: She is oriented to person, place, and time. She appears well-developed and well-nourished. No distress.  HENT:  Head: Normocephalic and atraumatic.  Mouth/Throat: Oropharynx is clear and moist.  Eyes: Conjunctivae are normal. Pupils are equal, round, and reactive to light.  Neck: Normal range of motion. No thyromegaly present.  Cardiovascular: Normal rate, regular rhythm and normal heart sounds.   Pulmonary/Chest: Effort normal and breath sounds normal. No respiratory distress. She has no wheezes.  Musculoskeletal: Normal range of motion. She exhibits no edema.  Tender L greater trochanter.  Antalgic gait.    Lymphadenopathy:    She has no cervical adenopathy.  Neurological: She is alert and oriented to person, place, and time.  Psychiatric: She has a normal mood and affect. Her behavior is normal. Thought content normal.    ASSESSMENT/PLAN:  1. Trochanteric bursitis, left hip Ice/NSAID discussed  2. Vitamin D deficiency On replacement  3. Tobacco abuse discussed  4. Essential hypertension controlled  5.  Class 2 obesity due to excess calories with serious comorbidity and body mass index (BMI) of 38.0 to 38.9 in adult discussed - Amb ref to Medical Nutrition Therapy-MNT   Patient Instructions  Use ice and naprosyn ( with food) when hip painful  Your blood pressure is good  I am referring you to a nutrition for help with diet  See me in six month   Raylene Everts, MD

## 2016-01-31 NOTE — Patient Instructions (Addendum)
Use ice and naprosyn ( with food) when hip painful  Your blood pressure is good  I am referring you to a nutrition for help with diet  See me in six month

## 2016-02-12 ENCOUNTER — Ambulatory Visit: Payer: Medicare Other | Admitting: Nutrition

## 2016-04-22 ENCOUNTER — Telehealth: Payer: Self-pay | Admitting: Family Medicine

## 2016-04-23 IMAGING — DX DG ANKLE COMPLETE 3+V*L*
3 series · 3 of 3 positions shown · non-contrast
Comparison: None.

CLINICAL DATA: Patient fell today landing on left ankle and right
knee. Pain.

EXAM:
LEFT ANKLE COMPLETE - 3+ VIEW

[ankle ap]
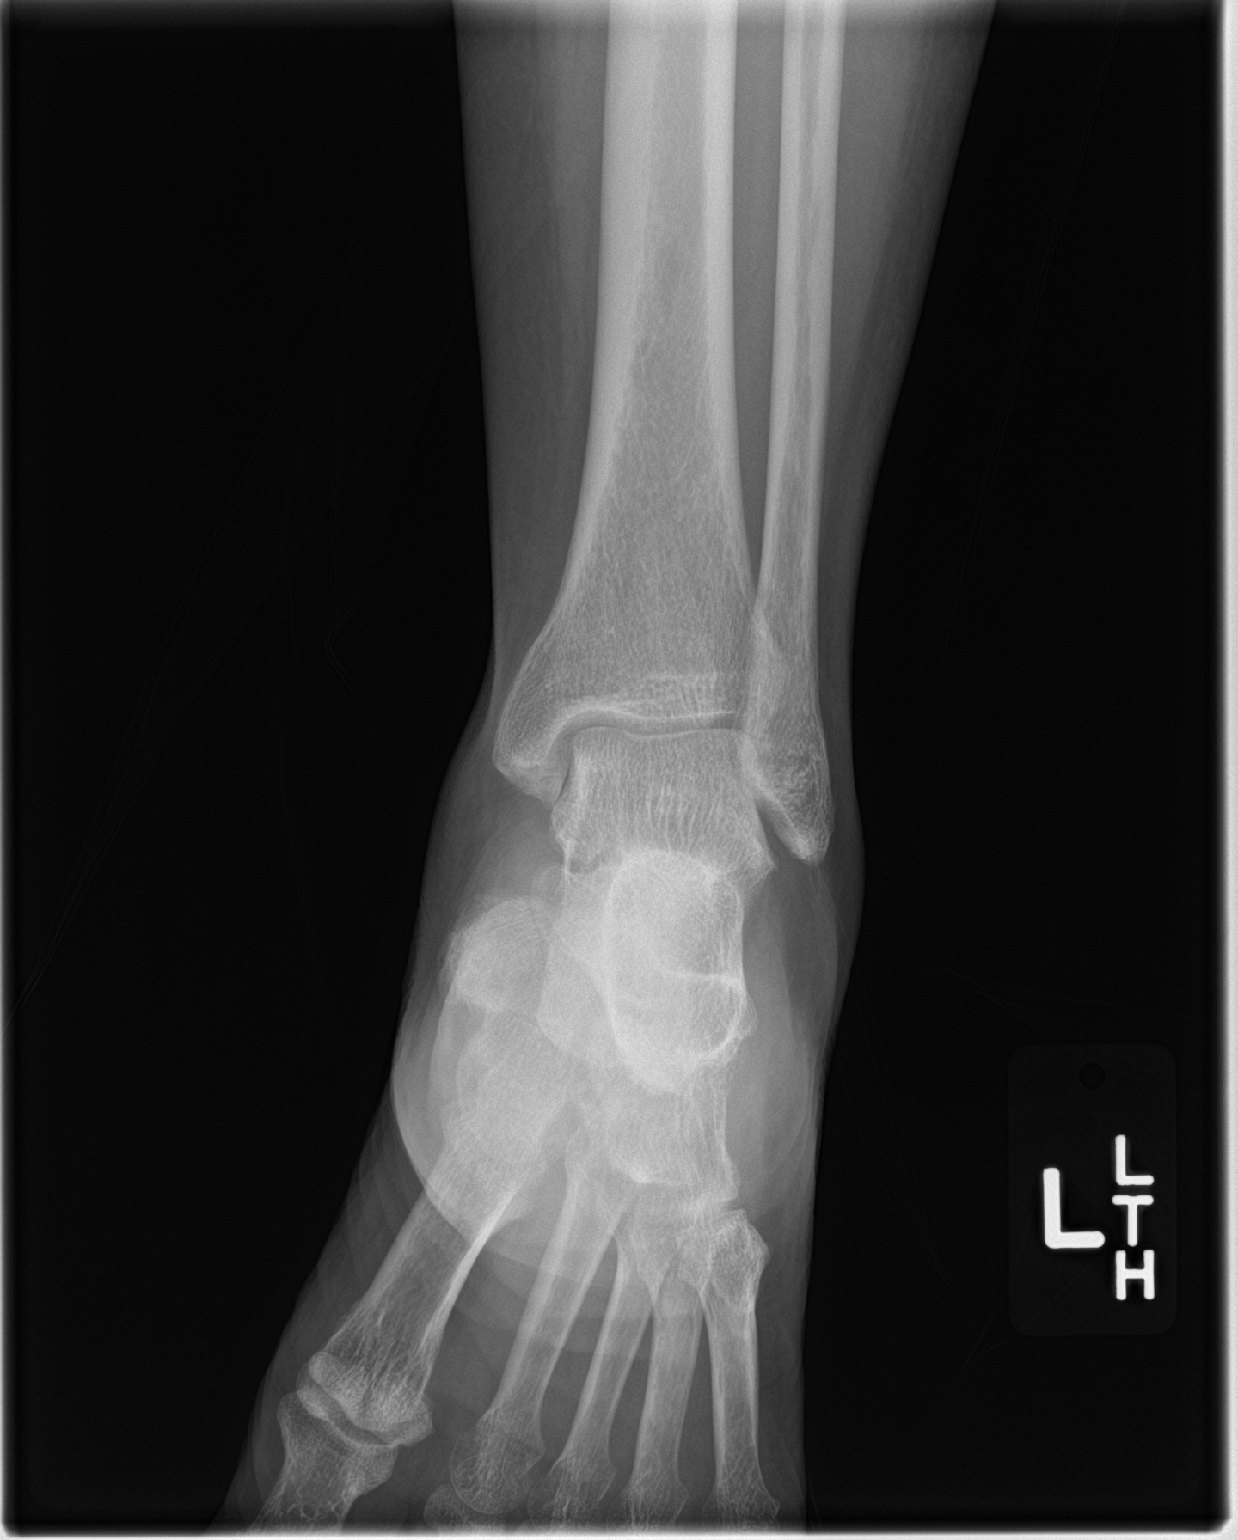

[ankle obl]
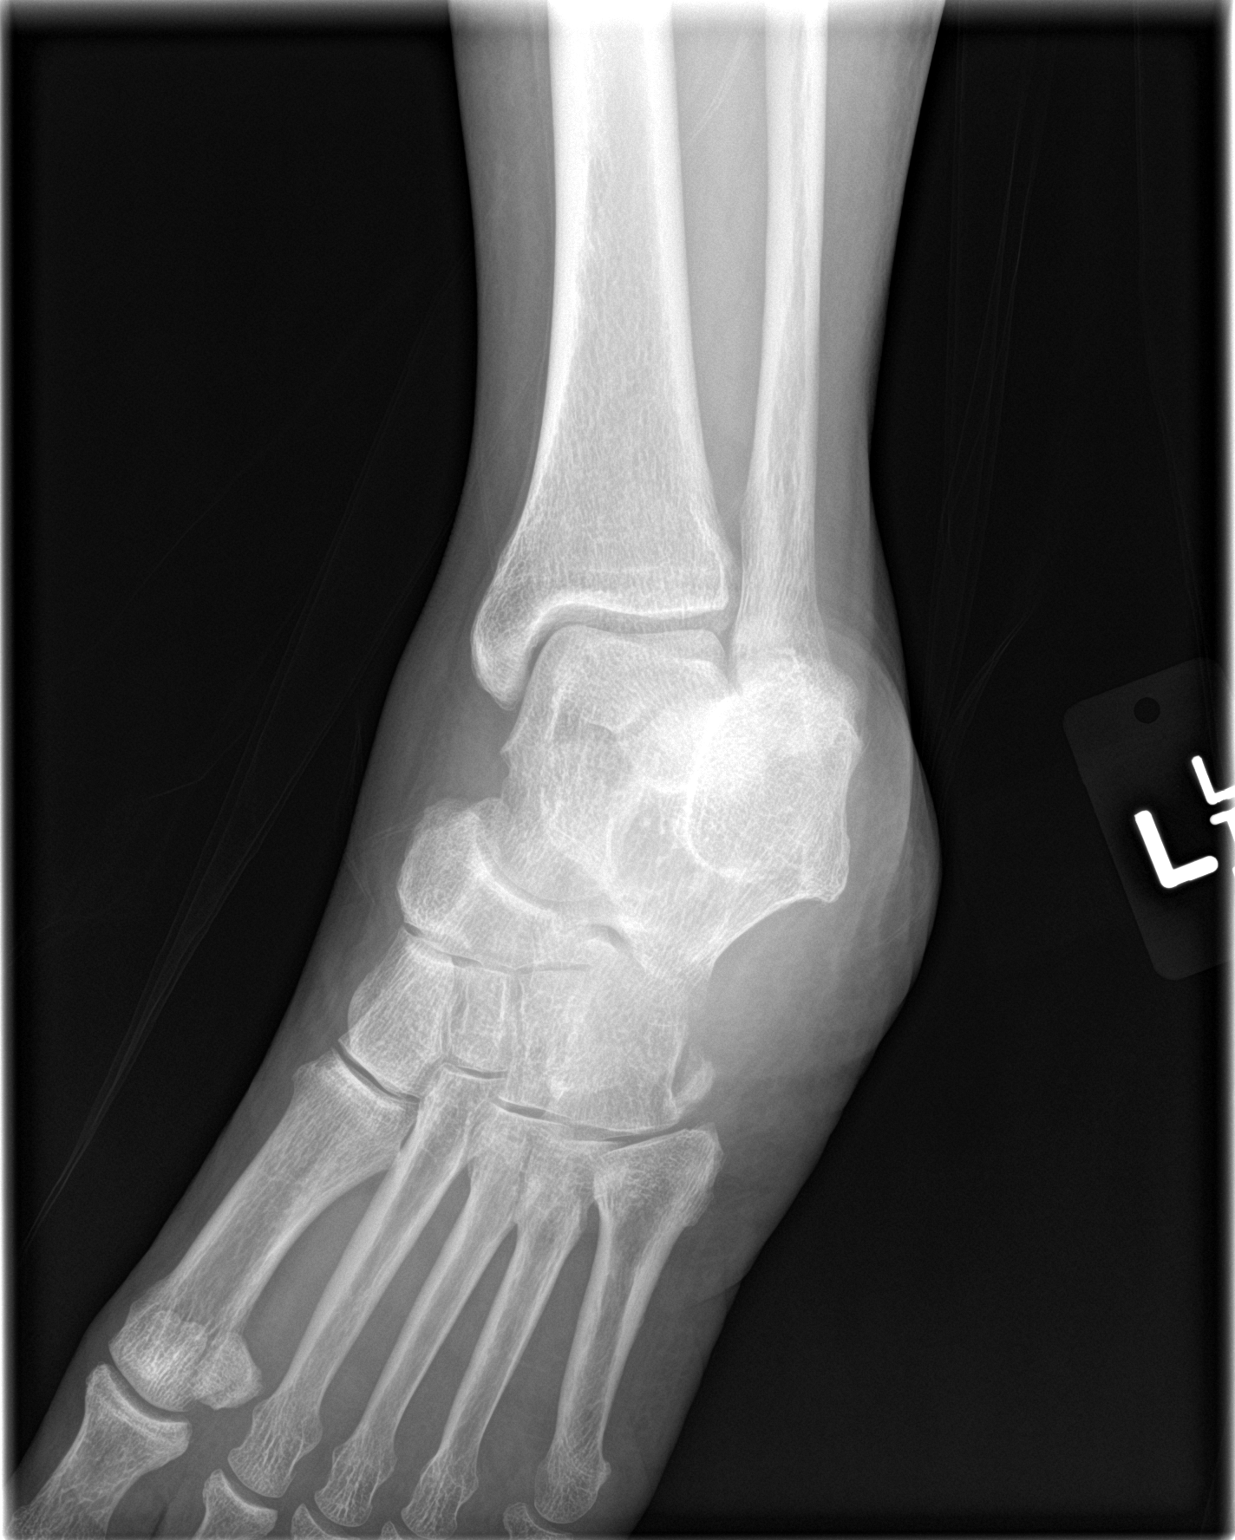

[ankle lat]
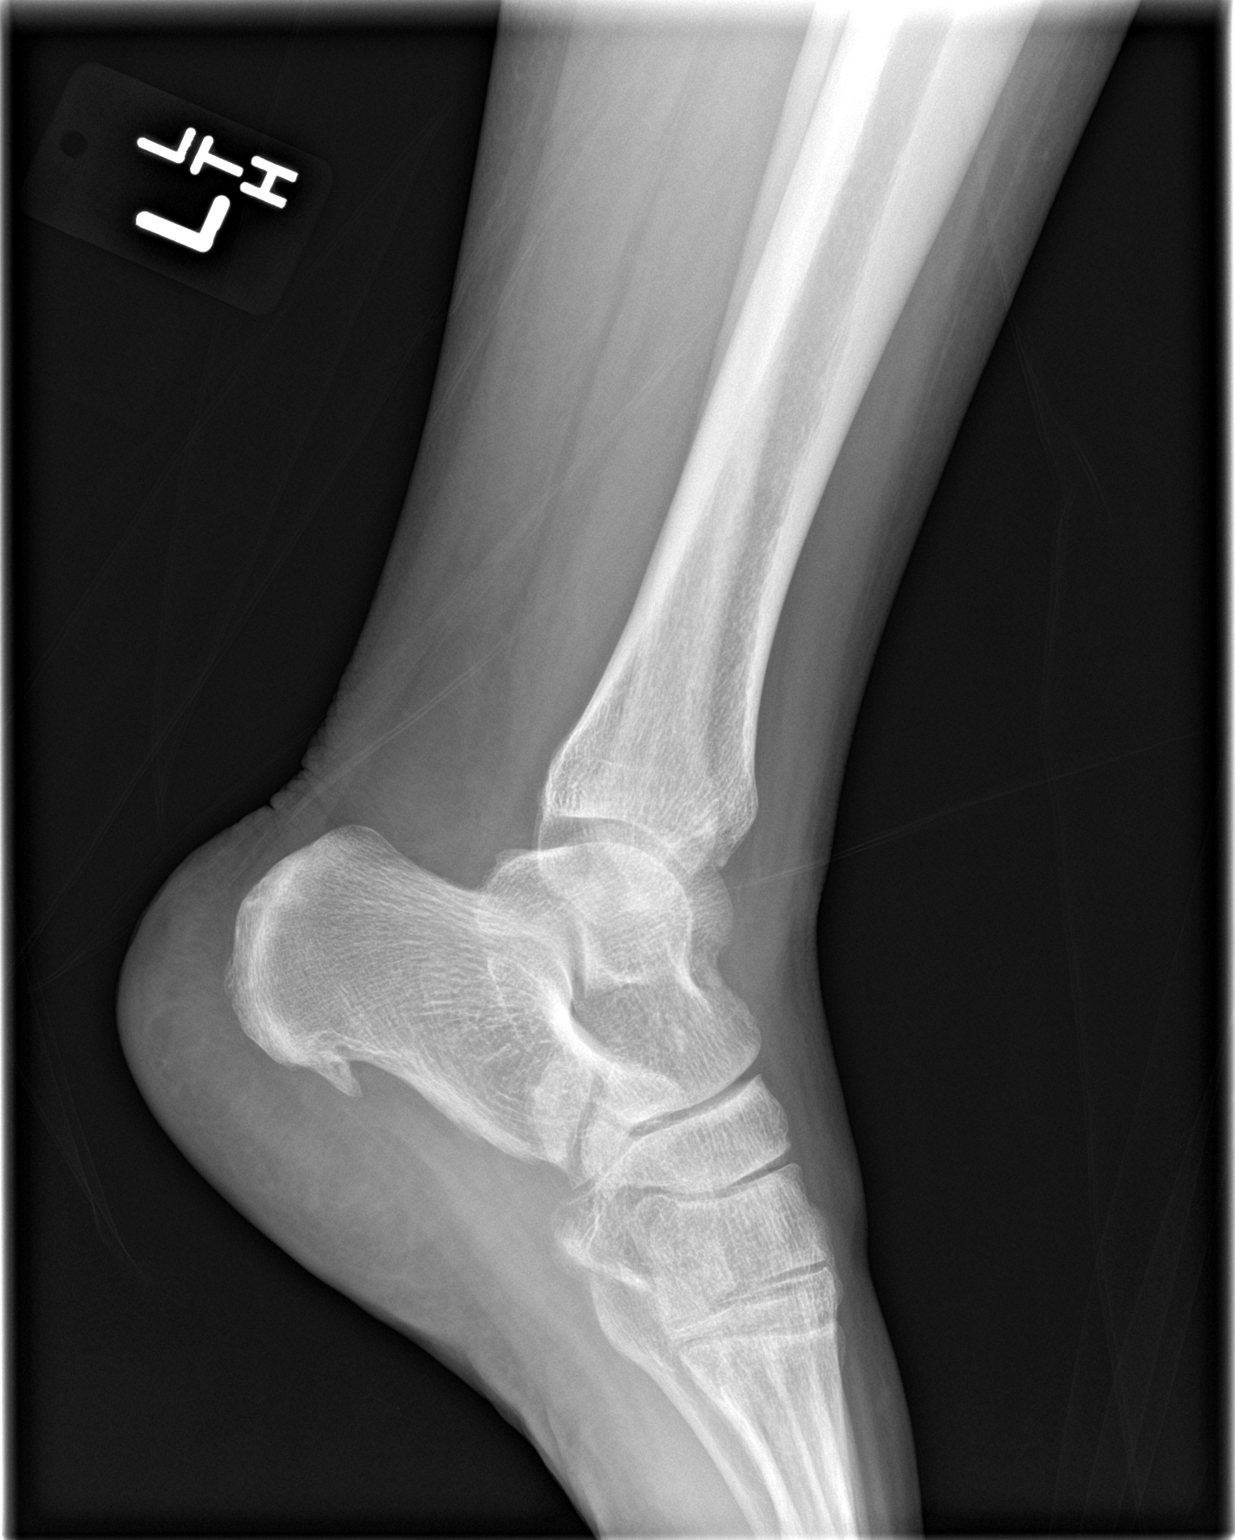

[3 of 3 positions shown; findings below may reference images not displayed]

FINDINGS: Old ununited ossicle over the cuboidal bone. Prominent plantar
calcaneal spur. No evidence of acute fracture or subluxation. No
focal bone lesion or bone destruction. Bone cortex and trabecular
architecture appear intact. No radiopaque soft tissue foreign
bodies.
IMPRESSION: Negative.

## 2016-04-23 IMAGING — DX DG KNEE COMPLETE 4+V*R*
4 series · 4 of 4 positions shown · non-contrast
Comparison: None.

CLINICAL DATA: Patient fell today, landing on the left ankle and
right knee. Pain.

EXAM:
RIGHT KNEE - COMPLETE 4+ VIEW

[knee ap]
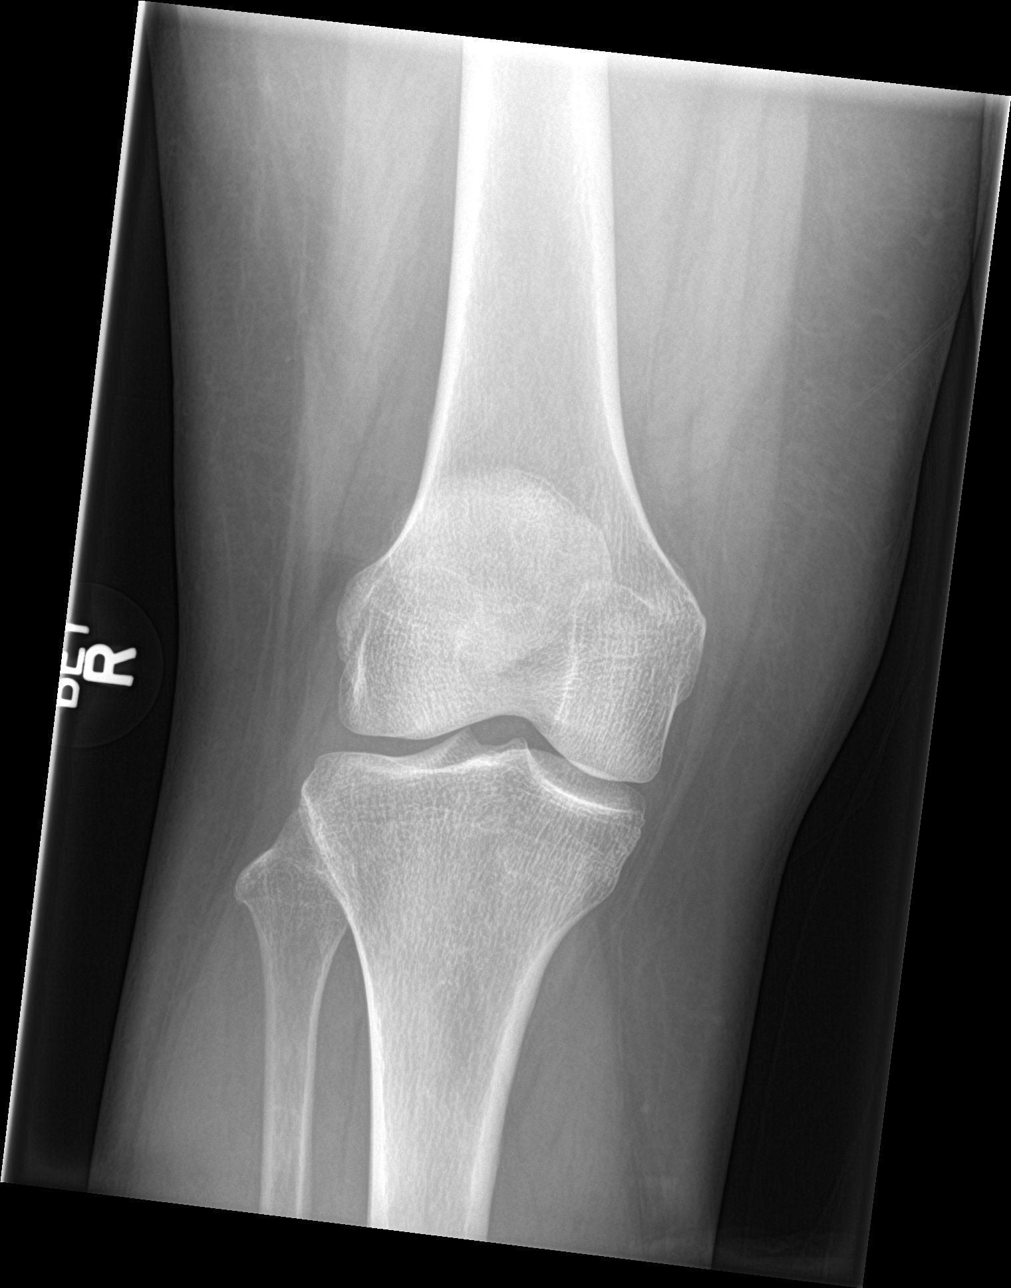

[knee obl (1 of 2)]
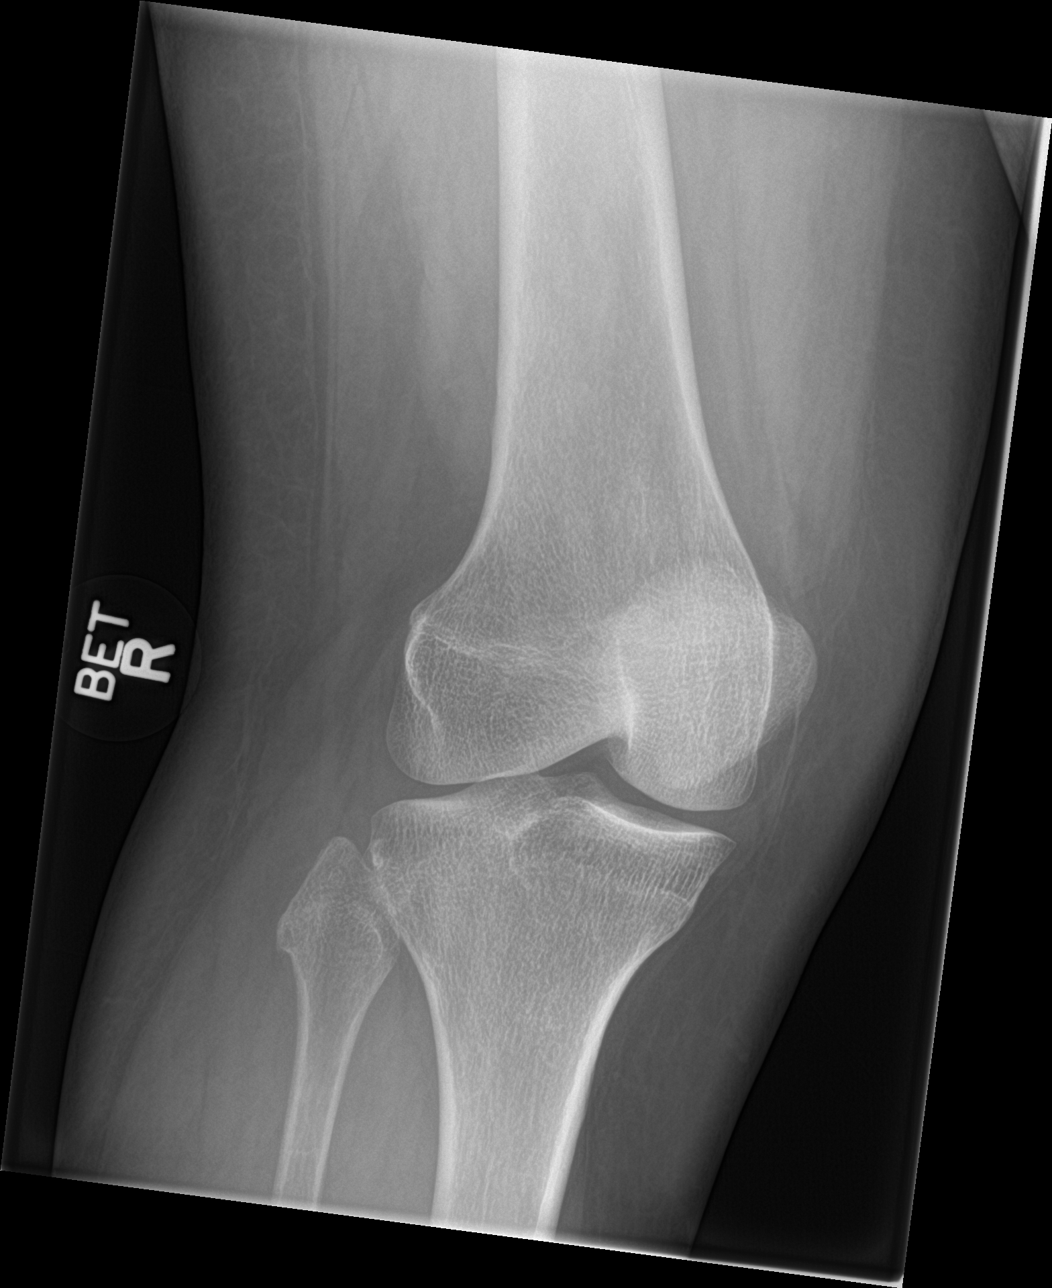

[knee obl (2 of 2)]
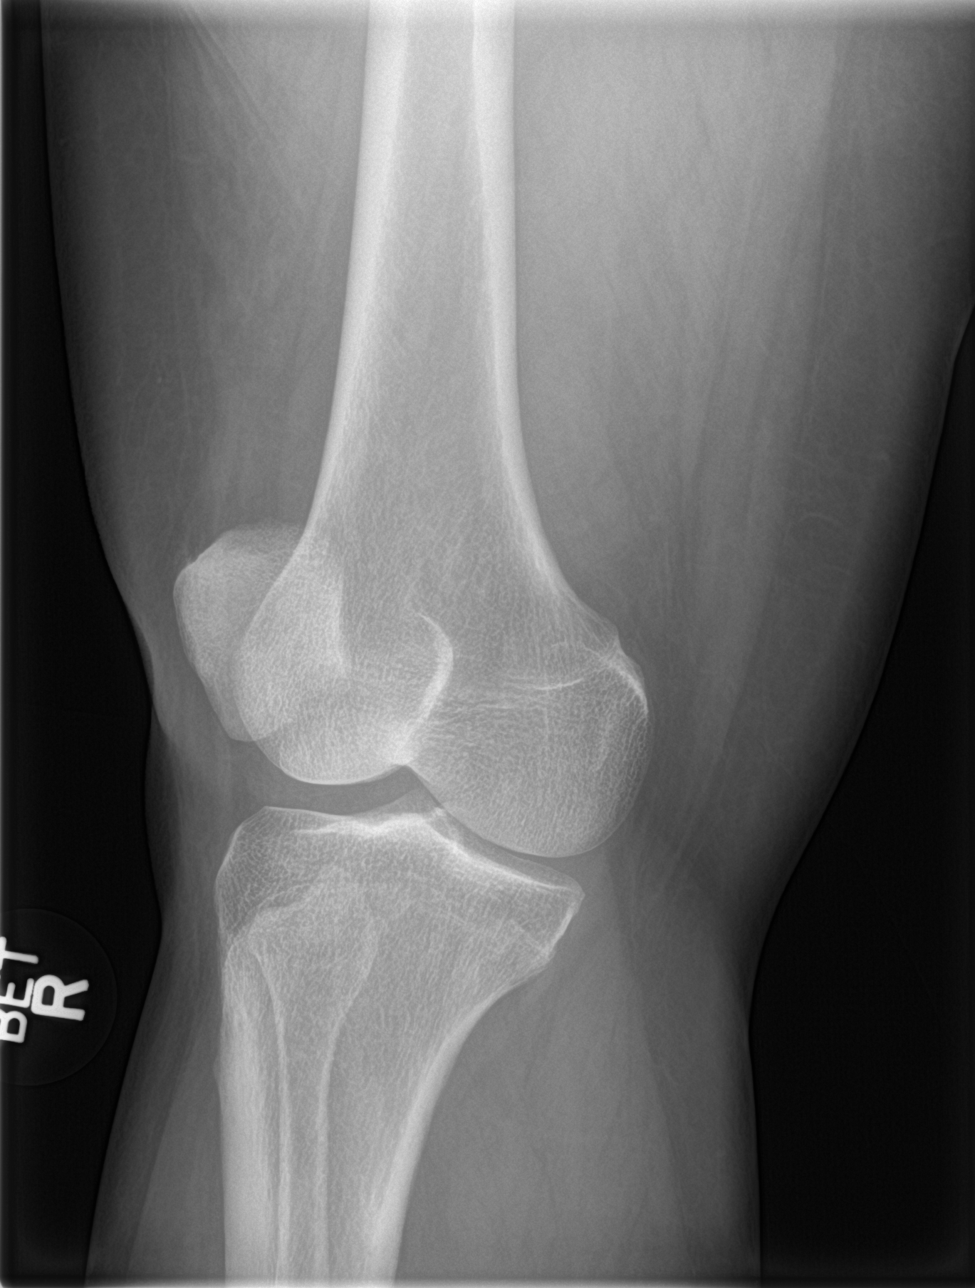

[knee lat]
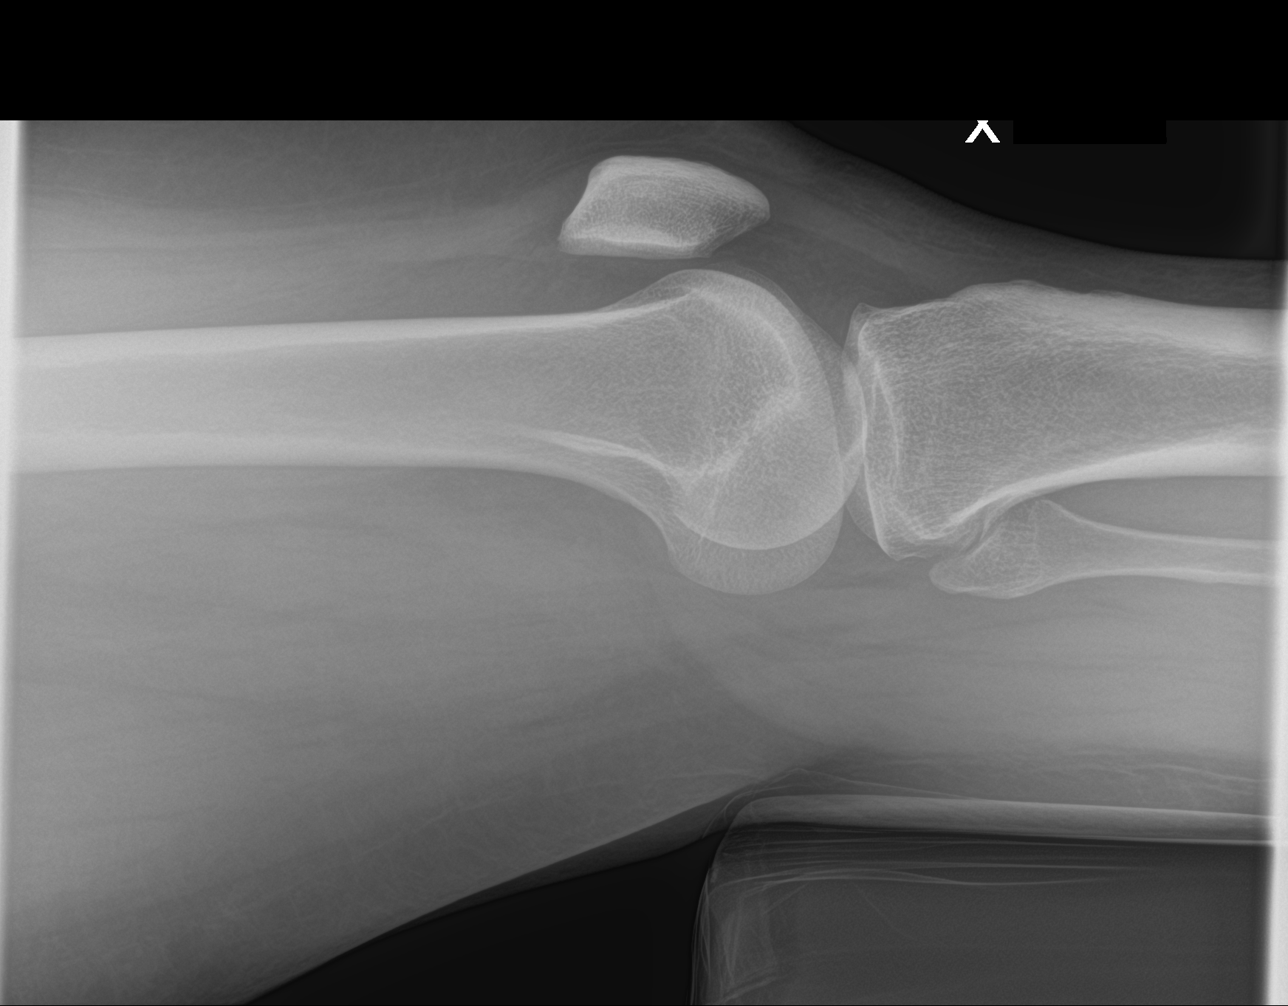

[4 of 4 positions shown; findings below may reference images not displayed]

FINDINGS: There is no evidence of fracture, dislocation, or joint effusion.
There is no evidence of arthropathy or other focal bone abnormality.
Soft tissues are unremarkable.
IMPRESSION: Negative.

## 2016-05-01 ENCOUNTER — Ambulatory Visit: Payer: Medicare Other

## 2016-05-29 ENCOUNTER — Ambulatory Visit (INDEPENDENT_AMBULATORY_CARE_PROVIDER_SITE_OTHER): Payer: Medicare Other

## 2016-05-29 VITALS — BP 126/74 | HR 62 | Temp 97.6°F | Ht <= 58 in | Wt 190.0 lb

## 2016-05-29 DIAGNOSIS — Z0000 Encounter for general adult medical examination without abnormal findings: Secondary | ICD-10-CM

## 2016-05-29 DIAGNOSIS — Z1211 Encounter for screening for malignant neoplasm of colon: Secondary | ICD-10-CM | POA: Diagnosis not present

## 2016-05-29 NOTE — Patient Instructions (Addendum)
[name] , Thank you for taking time to come for your Medicare Wellness Visit. I appreciate your ongoing commitment to your health goals. Please review the following plan we discussed and let me know if I can assist you in the future.   Screening recommendations/referrals: Colonoscopy: Due, we will call and schedule an appointment for you at [facility]: Due, we will call and schedule an appointment for you at [facility]. Bone Density: Due at age 60 Recommended yearly ophthalmology/optometry visit for glaucoma screening and checkup Recommended yearly dental visit for hygiene and checkup  Vaccinations: Influenza vaccine: Up to date, next due 08/2016 Pneumococcal vaccine: Due at age 60 Tdap vaccine: Up to date, next due 11/2025 Shingles vaccine: Due, declines    Advanced directives: Advance directive discussed with you today. I have provided a copy for you to complete at home and have notarized. Once this is complete please bring a copy in to our office so we can scan it into your chart.  Conditions/risks identified: Obese, recommend starting a routine exercise program at least 3 days a week for 30-45 minutes at a time as tolerated.   Next appointment: Follow up with Dr. Meda Coffee on 07/30/2016 at 1:40 pm. Follow up in 1 year for your annual wellness visit.   Preventive Care 40-64 Years, Female Preventive care refers to lifestyle choices and visits with your health care provider that can promote health and wellness. What does preventive care include?  A yearly physical exam. This is also called an annual well check.  Dental exams once or twice a year.  Routine eye exams. Ask your health care provider how often you should have your eyes checked.  Personal lifestyle choices, including:  Daily care of your teeth and gums.  Regular physical activity.  Eating a healthy diet.  Avoiding tobacco and drug use.  Limiting alcohol use.  Practicing safe sex.  Taking low-dose  aspirin daily starting at age 55.  Taking vitamin and mineral supplements as recommended by your health care provider. What happens during an annual well check? The services and screenings done by your health care provider during your annual well check will depend on your age, overall health, lifestyle risk factors, and family history of disease. Counseling  Your health care provider may ask you questions about your:  Alcohol use.  Tobacco use.  Drug use.  Emotional well-being.  Home and relationship well-being.  Sexual activity.  Eating habits.  Work and work Statistician.  Method of birth control.  Menstrual cycle.  Pregnancy history. Screening  You may have the following tests or measurements:  Height, weight, and BMI.  Blood pressure.  Lipid and cholesterol levels. These may be checked every 5 years, or more frequently if you are over 91 years old.  Skin check.  Lung cancer screening. You may have this screening every year starting at age 60 if you have a 30-pack-year history of smoking and currently smoke or have quit within the past 15 years.  Fecal occult blood test (FOBT) of the stool. You may have this test every year starting at age 60.  Flexible sigmoidoscopy or colonoscopy. You may have a sigmoidoscopy every 5 years or a colonoscopy every 10 years starting at age 59.  Hepatitis C blood test.  Hepatitis B blood test.  Sexually transmitted disease (STD) testing.  Diabetes screening. This is done by checking your blood sugar (glucose) after you have not eaten for a while (fasting). You may have this done every 1-3 years.  Mammogram. This may be done every 1-2 years. Talk to your health care provider about when you should start having regular mammograms. This may depend on whether you have a family history of breast cancer.  BRCA-related cancer screening. This may be done if you have a family history of breast, ovarian, tubal, or peritoneal  cancers.  Pelvic exam and Pap test. This may be done every 3 years starting at age 21. Starting at age 30, this may be done every 5 years if you have a Pap test in combination with an HPV test.  Bone density scan. This is done to screen for osteoporosis. You may have this scan if you are at high risk for osteoporosis. Discuss your test results, treatment options, and if necessary, the need for more tests with your health care provider. Vaccines  Your health care provider may recommend certain vaccines, such as:  Influenza vaccine. This is recommended every year.  Tetanus, diphtheria, and acellular pertussis (Tdap, Td) vaccine. You may need a Td booster every 10 years.  Zoster vaccine. You may need this after age 60.  Pneumococcal 13-valent conjugate (PCV13) vaccine. You may need this if you have certain conditions and were not previously vaccinated.  Pneumococcal polysaccharide (PPSV23) vaccine. You may need one or two doses if you smoke cigarettes or if you have certain conditions. Talk to your health care provider about which screenings and vaccines you need and how often you need them. This information is not intended to replace advice given to you by your health care provider. Make sure you discuss any questions you have with your health care provider. Document Released: 02/10/2015 Document Revised: 10/04/2015 Document Reviewed: 11/15/2014 Elsevier Interactive Patient Education  2017 Elsevier Inc.    Fall Prevention in the Home Falls can cause injuries. They can happen to people of all ages. There are many things you can do to make your home safe and to help prevent falls. What can I do on the outside of my home?  Regularly fix the edges of walkways and driveways and fix any cracks.  Remove anything that might make you trip as you walk through a door, such as a raised step or threshold.  Trim any bushes or trees on the path to your home.  Use bright outdoor lighting.  Clear  any walking paths of anything that might make someone trip, such as rocks or tools.  Regularly check to see if handrails are loose or broken. Make sure that both sides of any steps have handrails.  Any raised decks and porches should have guardrails on the edges.  Have any leaves, snow, or ice cleared regularly.  Use sand or salt on walking paths during winter.  Clean up any spills in your garage right away. This includes oil or grease spills. What can I do in the bathroom?  Use night lights.  Install grab bars by the toilet and in the tub and shower. Do not use towel bars as grab bars.  Use non-skid mats or decals in the tub or shower.  If you need to sit down in the shower, use a plastic, non-slip stool.  Keep the floor dry. Clean up any water that spills on the floor as soon as it happens.  Remove soap buildup in the tub or shower regularly.  Attach bath mats securely with double-sided non-slip rug tape.  Do not have throw rugs and other things on the floor that can make you trip. What can I do in the bedroom?    Use night lights.  Make sure that you have a light by your bed that is easy to reach.  Do not use any sheets or blankets that are too big for your bed. They should not hang down onto the floor.  Have a firm chair that has side arms. You can use this for support while you get dressed.  Do not have throw rugs and other things on the floor that can make you trip. What can I do in the kitchen?  Clean up any spills right away.  Avoid walking on wet floors.  Keep items that you use a lot in easy-to-reach places.  If you need to reach something above you, use a strong step stool that has a grab bar.  Keep electrical cords out of the way.  Do not use floor polish or wax that makes floors slippery. If you must use wax, use non-skid floor wax.  Do not have throw rugs and other things on the floor that can make you trip. What can I do with my stairs?  Do not  leave any items on the stairs.  Make sure that there are handrails on both sides of the stairs and use them. Fix handrails that are broken or loose. Make sure that handrails are as long as the stairways.  Check any carpeting to make sure that it is firmly attached to the stairs. Fix any carpet that is loose or worn.  Avoid having throw rugs at the top or bottom of the stairs. If you do have throw rugs, attach them to the floor with carpet tape.  Make sure that you have a light switch at the top of the stairs and the bottom of the stairs. If you do not have them, ask someone to add them for you. What else can I do to help prevent falls?  Wear shoes that:  Do not have high heels.  Have rubber bottoms.  Are comfortable and fit you well.  Are closed at the toe. Do not wear sandals.  If you use a stepladder:  Make sure that it is fully opened. Do not climb a closed stepladder.  Make sure that both sides of the stepladder are locked into place.  Ask someone to hold it for you, if possible.  Clearly mark and make sure that you can see:  Any grab bars or handrails.  First and last steps.  Where the edge of each step is.  Use tools that help you move around (mobility aids) if they are needed. These include:  Canes.  Walkers.  Scooters.  Crutches.  Turn on the lights when you go into a dark area. Replace any light bulbs as soon as they burn out.  Set up your furniture so you have a clear path. Avoid moving your furniture around.  If any of your floors are uneven, fix them.  If there are any pets around you, be aware of where they are.  Review your medicines with your doctor. Some medicines can make you feel dizzy. This can increase your chance of falling. Ask your doctor what other things that you can do to help prevent falls. This information is not intended to replace advice given to you by your health care provider. Make sure you discuss any questions you have with  your health care provider. Document Released: 11/10/2008 Document Revised: 06/22/2015 Document Reviewed: 02/18/2014 Elsevier Interactive Patient Education  2017 [place].   Steps to Quit Smoking Smoking tobacco can be bad for  your health. It can also affect almost every organ in your body. Smoking puts you and people around you at risk for many serious long-lasting (chronic) diseases. Quitting smoking is hard, but it is one of the best things that you can do for your health. It is never too late to quit. What are the benefits of quitting smoking? When you quit smoking, you lower your risk for getting serious diseases and conditions. They can include: Lung cancer or lung disease. Heart disease. Stroke. Heart attack. Not being able to have children (infertility). Weak bones (osteoporosis) and broken bones (fractures). If you have coughing, wheezing, and shortness of breath, those symptoms may get better when you quit. You may also get sick less often. If you are pregnant, quitting smoking can help to lower your chances of having a baby of low birth weight. What can I do to help me quit smoking? Talk with your doctor about what can help you quit smoking. Some things you can do (strategies) include: Quitting smoking totally, instead of slowly cutting back how much you smoke over a period of time. Going to in-person counseling. You are more likely to quit if you go to many counseling sessions. Using resources and support systems, such as: Online chats with a Social worker. Phone quitlines. Printed Furniture conservator/restorer. Support groups or group counseling. Text messaging programs. Mobile phone apps or applications. Taking medicines. Some of these medicines may have nicotine in them. If you are pregnant or breastfeeding, do not take any medicines to quit smoking unless your doctor says it is okay. Talk with your doctor about counseling or other things that can help you. Talk with your doctor  about using more than one strategy at the same time, such as taking medicines while you are also going to in-person counseling. This can help make quitting easier. What things can I do to make it easier to quit? Quitting smoking might feel very hard at first, but there is a lot that you can do to make it easier. Take these steps: Talk to your family and friends. Ask them to support and encourage you. Call phone quitlines, reach out to support groups, or work with a Social worker. Ask people who smoke to not smoke around you. Avoid places that make you want (trigger) to smoke, such as: Bars. Parties. Smoke-break areas at work. Spend time with people who do not smoke. Lower the stress in your life. Stress can make you want to smoke. Try these things to help your stress: Getting regular exercise. Deep-breathing exercises. Yoga. Meditating. Doing a body scan. To do this, close your eyes, focus on one area of your body at a time from head to toe, and notice which parts of your body are tense. Try to relax the muscles in those areas. Download or buy apps on your mobile phone or tablet that can help you stick to your quit plan. There are many free apps, such as QuitGuide from the [employer] Office manager for Disease Control and Prevention). You can find more support from smokefree.gov and other websites. This information is not intended to replace advice given to you by your health care provider. Make sure you discuss any questions you have with your health care provider. Document Released: 11/10/2008 Document Revised: 09/12/2015 Document Reviewed: 05/31/2014 Elsevier Interactive Patient Education  2017 [employer].

## 2016-05-29 NOTE — Progress Notes (Signed)
Subjective:   SANTOSHA JIVIDEN is a 61 y.o. female who presents for an Initial Medicare Annual Wellness Visit.  Review of Systems:   Cardiac Risk Factors include: dyslipidemia;hypertension;obesity (BMI >30kg/m2);sedentary lifestyle;smoking/ tobacco exposure     Objective:    Today's Vitals   05/29/16 1541  BP: 126/74  Pulse: 62  Temp: 97.6 F (36.4 C)  TempSrc: Oral  SpO2: 98%  Weight: 190 lb 0.6 oz (86.2 kg)  Height: 4\' 9"  (1.448 m)   Body mass index is 41.12 kg/m.   Current Medications (verified) Outpatient Encounter Prescriptions as of 05/29/2016  Medication Sig  . aspirin EC 81 MG tablet Take 81 mg by mouth daily.  Marland Kitchen BLACK COHOSH EXTRACT PO Take 1 capsule by mouth daily.   . cholecalciferol (VITAMIN D) 1000 units tablet Take 1,000 Units by mouth daily.  Marland Kitchen FLUoxetine (PROZAC) 20 MG capsule Take 1 capsule (20 mg total) by mouth daily.  Marland Kitchen losartan-hydrochlorothiazide (HYZAAR) 50-12.5 MG tablet Take 1 tablet by mouth daily.  . naproxen (NAPROSYN) 500 MG tablet Take 1 tablet (500 mg total) by mouth 2 (two) times daily. (Patient taking differently: Take 500 mg by mouth 2 (two) times daily as needed for mild pain. )  . pravastatin (PRAVACHOL) 40 MG tablet Take 1 tablet (40 mg total) by mouth daily.  . [DISCONTINUED] Vitamin D, Ergocalciferol, (DRISDOL) 50000 units CAPS capsule Take 1 capsule (50,000 Units total) by mouth every 7 (seven) days.   No facility-administered encounter medications on file as of 05/29/2016.     Allergies (verified) Lisinopril and Penicillins   History: Past Medical History:  Diagnosis Date  . Allergy   . Anxiety   . Arthritis   . Depression   . GERD (gastroesophageal reflux disease)   . Hyperlipidemia   . Hypertension    Past Surgical History:  Procedure Laterality Date  . ABDOMINAL HYSTERECTOMY  1999   fibroid   Family History  Problem Relation Age of Onset  . Hypertension Mother   . Arthritis Mother   . Heart disease Maternal  Grandmother   . Kidney disease Maternal Grandmother    Social History   Occupational History  . disability     back and hip   Social History Main Topics  . Smoking status: Current Every Day Smoker    Packs/day: 0.10    Types: Cigarettes  . Smokeless tobacco: Never Used  . Alcohol use Yes     Comment: occasional  . Drug use: No  . Sexual activity: Yes    Birth control/ protection: Surgical    Tobacco Counseling Ready to quit: No Counseling given: Yes   Activities of Daily Living In your present state of health, do you have any difficulty performing the following activities: 05/29/2016 01/31/2016  Hearing? N N  Vision? N N  Difficulty concentrating or making decisions? Y N  Walking or climbing stairs? N N  Dressing or bathing? N N  Doing errands, shopping? N N  Preparing Food and eating ? N -  Using the Toilet? N -  In the past six months, have you accidently leaked urine? N -  Do you have problems with loss of bowel control? N -  Managing your Medications? N -  Managing your Finances? N -  Housekeeping or managing your Housekeeping? N -  Some recent data might be hidden    Immunizations and Health Maintenance Immunization History  Administered Date(s) Administered  . Influenza,inj,Quad PF,36+ Mos 11/29/2015  . Tdap 11/29/2015  Health Maintenance Due  Topic Date Due  . COLONOSCOPY  05/10/2006  . MAMMOGRAM  01/13/2015    Patient Care Team: Raylene Everts, MD as PCP - General (Family Medicine)  Indicate any recent Medical Services you may have received from other than [facility] providers in the past year (date may be approximate).     Assessment:   This is a routine wellness examination for [place].  Hearing/Vision screen  Visual Acuity Screening   Right eye Left eye Both eyes  Without correction: 20/25 20/20 20/20   With correction:       Dietary issues and exercise activities discussed: Current Exercise Habits: The patient does not participate in  regular exercise at present, Exercise limited by: None identified  Goals    . Blood Pressure < 140/90    . Exercise 3x per week (30 min per time)          Recommend starting a routine exercise program at least 3 days a week for 30-45 minutes at a time as tolerated.      Marland Kitchen STOP SMOKING    . Weight (lb) < 150 lb (68 kg)      Depression Screen PHQ 2/9 Scores 05/29/2016 01/31/2016 11/29/2015  PHQ - 2 Score 2 0 0  PHQ- 9 Score 6 - -    Fall Risk Fall Risk  05/29/2016 01/31/2016 11/29/2015  Falls in the past year? No No No    Cognitive Function: Normal   6CIT Screen 05/29/2016  What Year? 0 points  What month? 0 points  What time? 0 points  Count back from 20 0 points  Months in reverse 0 points  Repeat phrase 0 points  Total Score 0    Screening Tests Health Maintenance  Topic Date Due  . COLONOSCOPY  05/10/2006  . MAMMOGRAM  01/13/2015  . INFLUENZA VACCINE  08/28/2016  . PAP SMEAR  11/29/2018  . TETANUS/TDAP  11/28/2025  . Hepatitis C Screening  Completed  . HIV Screening  Completed      Plan:   I have personally reviewed and noted the following in the patient's chart:   . Medical and social history . Use of alcohol, tobacco or illicit drugs  . Current medications and supplements . Functional ability and status . Nutritional status . Physical activity . Advanced directives . List of other physicians . Hospitalizations, surgeries, and ER visits in previous 12 months . Vitals . Screenings to include cognitive, depression, and falls . Referrals and appointments- Referral to [facility] for colonoscopy consult.   In addition, I have reviewed and discussed with patient certain preventive protocols, quality metrics, and best practice recommendations. A written personalized care plan for preventive services as well as general preventive health recommendations were provided to patient.     Stormy Fabian, LPN   0/08/1446

## 2016-06-10 ENCOUNTER — Ambulatory Visit (HOSPITAL_COMMUNITY)
Admission: RE | Admit: 2016-06-10 | Discharge: 2016-06-10 | Disposition: A | Discharge status: Home / Self Care | Payer: Medicare Other | Source: Ambulatory Visit | Attending: Family Medicine | Admitting: Family Medicine

## 2016-06-10 ENCOUNTER — Other Ambulatory Visit: Payer: Self-pay | Admitting: Family Medicine

## 2016-06-10 ENCOUNTER — Ambulatory Visit (HOSPITAL_COMMUNITY): Payer: Medicare Other

## 2016-06-10 DIAGNOSIS — Z1231 Encounter for screening mammogram for malignant neoplasm of breast: Secondary | ICD-10-CM | POA: Diagnosis not present

## 2016-07-03 ENCOUNTER — Telehealth: Payer: Self-pay

## 2016-07-03 NOTE — Telephone Encounter (Signed)
Pt received a letter from DS to be triaged. She is having no GI problems, no blood thinners or hx of heart attacks. Please call her at [phone]

## 2016-07-09 ENCOUNTER — Telehealth: Payer: Self-pay

## 2016-07-09 NOTE — Telephone Encounter (Signed)
See separate triage.  

## 2016-07-09 NOTE — Telephone Encounter (Signed)
Pt called again today asking to be triaged. She called on 07/03/2016. Please call her back at [phone]

## 2016-07-11 ENCOUNTER — Other Ambulatory Visit: Payer: Self-pay

## 2016-07-11 DIAGNOSIS — Z1211 Encounter for screening for malignant neoplasm of colon: Secondary | ICD-10-CM

## 2016-07-11 NOTE — Telephone Encounter (Signed)
Gastroenterology Pre-Procedure Review  Request Date:07/09/2016 Requesting Physician: Dr. Raylene Everts  PATIENT REVIEW QUESTIONS: The patient responded to the following health history questions as indicated:    1. Diabetes Melitis: no 2. Joint replacements in the past 12 months: no 3. Major health problems in the past 3 months: no 4. Has an artificial valve or MVP: no 5. Has a defibrillator: no 6. Has been advised in past to take antibiotics in advance of a procedure like teeth cleaning: no 7. Family history of colon cancer: no  8. Alcohol Use: no 9. History of sleep apnea: no  10. History of coronary artery or other vascular stents placed within the last 12 months: no    MEDICATIONS & ALLERGIES:    Patient reports the following regarding taking any blood thinners:   Plavix? no Aspirin? YES Coumadin? no Brilinta? no Xarelto? no Eliquis? no Pradaxa? no Savaysa? no Effient? no  Patient confirms/reports the following medications:  Current Outpatient Prescriptions  Medication Sig Dispense Refill  . aspirin EC 81 MG tablet Take 81 mg by mouth daily.    Marland Kitchen BLACK COHOSH EXTRACT PO Take 1 capsule by mouth daily.     . cholecalciferol (VITAMIN D) 1000 units tablet Take 1,000 Units by mouth daily.    Marland Kitchen FLUoxetine (PROZAC) 20 MG capsule Take 1 capsule (20 mg total) by mouth daily. 90 capsule 3  . losartan-hydrochlorothiazide (HYZAAR) 50-12.5 MG tablet Take 1 tablet by mouth daily. 30 tablet 11  . naproxen (NAPROSYN) 500 MG tablet Take 1 tablet (500 mg total) by mouth 2 (two) times daily. (Patient taking differently: Take 500 mg by mouth 2 (two) times daily as needed for mild pain. ) 30 tablet 0  . pravastatin (PRAVACHOL) 40 MG tablet Take 1 tablet (40 mg total) by mouth daily. 30 tablet 11   No current facility-administered medications for this visit.     Patient confirms/reports the following allergies:  Allergies  Allergen Reactions  . Lisinopril Cough  . Penicillins Rash     No orders of the defined types were placed in this encounter.   AUTHORIZATION INFORMATION Primary Insurance:   ID #:  Group #:  Pre-Cert / Auth required:  Pre-Cert / Auth #:   Secondary Insurance:   ID #:  Group #:  Pre-Cert / Auth required:  Pre-Cert / Auth #:   SCHEDULE INFORMATION: Procedure has been scheduled as follows:  Date:  08/21/2016           Time:  9:30 AM Location: [facility] Short Stay  This Gastroenterology Pre-Precedure Review Form is being routed to the following provider(s): Barney Drain, MD

## 2016-07-24 ENCOUNTER — Other Ambulatory Visit: Payer: Self-pay

## 2016-07-24 MED ORDER — PRAVASTATIN SODIUM 40 MG PO TABS: 40.0000 mg | ORAL_TABLET | Freq: Every day | ORAL | 3 refills | Status: DC

## 2016-07-24 MED ORDER — NAPROXEN 500 MG PO TABS: 500.0000 mg | ORAL_TABLET | Freq: Two times a day (BID) | ORAL | 2 refills | Status: DC | PRN

## 2016-07-24 MED ORDER — LOSARTAN POTASSIUM-HCTZ 50-12.5 MG PO TABS: 1.0000 | ORAL_TABLET | Freq: Every day | ORAL | 3 refills | Status: DC

## 2016-07-24 MED ORDER — FLUOXETINE HCL 20 MG PO CAPS: 20.0000 mg | ORAL_CAPSULE | Freq: Every day | ORAL | 3 refills | Status: DC

## 2016-07-25 NOTE — Telephone Encounter (Signed)
SUPREP SPLIT DOSING-REGULAR breakfast then CLEAR LIQUIDS after 9 am.   

## 2016-07-26 ENCOUNTER — Other Ambulatory Visit: Payer: Self-pay

## 2016-07-30 ENCOUNTER — Ambulatory Visit: Payer: Medicare Other | Admitting: Family Medicine

## 2016-07-30 MED ORDER — NA SULFATE-K SULFATE-MG SULF 17.5-3.13-1.6 GM/177ML PO SOLN: 1.0000 | ORAL | 0 refills | Status: DC

## 2016-07-30 NOTE — Telephone Encounter (Signed)
Suprep sent to pharmacy 

## 2016-08-01 ENCOUNTER — Encounter: Payer: Self-pay | Admitting: Family Medicine

## 2016-08-01 ENCOUNTER — Ambulatory Visit (INDEPENDENT_AMBULATORY_CARE_PROVIDER_SITE_OTHER): Payer: Medicare Other | Admitting: Family Medicine

## 2016-08-01 VITALS — BP 140/80 | HR 88 | Temp 97.6°F | Resp 16 | Ht <= 58 in | Wt 188.0 lb

## 2016-08-01 DIAGNOSIS — E784 Other hyperlipidemia: Secondary | ICD-10-CM | POA: Diagnosis not present

## 2016-08-01 DIAGNOSIS — E559 Vitamin D deficiency, unspecified: Secondary | ICD-10-CM | POA: Diagnosis not present

## 2016-08-01 DIAGNOSIS — Z720 Tobacco use: Secondary | ICD-10-CM

## 2016-08-01 DIAGNOSIS — F3289 Other specified depressive episodes: Secondary | ICD-10-CM | POA: Diagnosis not present

## 2016-08-01 DIAGNOSIS — I10 Essential (primary) hypertension: Secondary | ICD-10-CM | POA: Diagnosis not present

## 2016-08-01 DIAGNOSIS — E7849 Other hyperlipidemia: Secondary | ICD-10-CM

## 2016-08-01 NOTE — Patient Instructions (Signed)
Continue to reduce the smoking - try to quit Walk every day that you are able Your Blood pressure is good No change in medicines Need flu shot it the fall Need a physical in 6 months, repeat labs at that time

## 2016-08-01 NOTE — Progress Notes (Signed)
Chief Complaint  Patient presents with  . Follow-up    6 month  Patient is here for a routine follow-up. She is compliant with her medications. She is scheduled for colonoscopy this month. She is up-to-date otherwise with her health care and testing Her blood pressure is controlled. She does not get regular exercise. This is, again, discussed with her. She is trying to quit smoking. She states she has reduced her smoking since her last visit in effort to quit. She does not feel she needs assistance. She is overweight. She is trying to lose weight. She's lost 2 pounds since her last visit.   Patient Active Problem List   Diagnosis Date Noted  . Vitamin D deficiency 01/31/2016  . Tobacco abuse 11/01/2015  . Depression 11/01/2015  . HTN (hypertension) 11/01/2015  . HLD (hyperlipidemia) 11/01/2015  . Degenerative joint disease (DJD) of lumbar spine 11/01/2015  . Osteoarthritis of left hip 11/01/2015    Outpatient Encounter Prescriptions as of 08/01/2016  Medication Sig  . aspirin EC 81 MG tablet Take 81 mg by mouth daily.  Marland Kitchen BLACK COHOSH EXTRACT PO Take 1 capsule by mouth daily.   . cholecalciferol (VITAMIN D) 1000 units tablet Take 1,000 Units by mouth daily.  Marland Kitchen FLUoxetine (PROZAC) 20 MG capsule Take 1 capsule (20 mg total) by mouth daily.  Marland Kitchen losartan-hydrochlorothiazide (HYZAAR) 50-12.5 MG tablet Take 1 tablet by mouth daily.  . Na Sulfate-K Sulfate-Mg Sulf (SUPREP BOWEL PREP KIT) 17.5-3.13-1.6 GM/180ML SOLN Take 1 kit by mouth as directed.  . naproxen (NAPROSYN) 500 MG tablet Take 1 tablet (500 mg total) by mouth 2 (two) times daily as needed.  . pravastatin (PRAVACHOL) 40 MG tablet Take 1 tablet (40 mg total) by mouth daily.   No facility-administered encounter medications on file as of 08/01/2016.     Allergies  Allergen Reactions  . Lisinopril Cough  . Penicillins Rash    Review of Systems  Constitutional: Negative for activity change, appetite change and unexpected  weight change.  HENT: Negative for congestion, dental problem, postnasal drip and rhinorrhea.   Eyes: Negative for redness and visual disturbance.  Respiratory: Negative for cough and shortness of breath.   Cardiovascular: Negative for chest pain, palpitations and leg swelling.  Gastrointestinal: Negative for abdominal pain, constipation and diarrhea.  Genitourinary: Negative for difficulty urinating and frequency.  Musculoskeletal: Positive for arthralgias and back pain.       Occasional  Neurological: Negative for dizziness and headaches.  Psychiatric/Behavioral: Negative for dysphoric mood and sleep disturbance. The patient is not nervous/anxious.     BP 140/80   Pulse 88   Temp 97.6 F (36.4 C) (Temporal)   Resp 16   Ht _0  (1.448 m)   Wt 188 lb 0.6 oz (85.3 kg)   SpO2 97%   BMI 40.69 kg/m   Physical Exam  Constitutional: She is oriented to person, place, and time. She appears well-developed and well-nourished. No distress.  Overweight. Pleasant  HENT:  Head: Normocephalic and atraumatic.  Mouth/Throat: Oropharynx is clear and moist.  Eyes: Conjunctivae are normal. Pupils are equal, round, and reactive to light.  Neck: Normal range of motion. No thyromegaly present.  Cardiovascular: Normal rate, regular rhythm and normal heart sounds.   Pulmonary/Chest: Effort normal and breath sounds normal. No respiratory distress. She has no wheezes.  Musculoskeletal: Normal range of motion. She exhibits no edema.  Lymphadenopathy:    She has no cervical adenopathy.  Neurological: She is alert and oriented to  person, place, and time.  Psychiatric: She has a normal mood and affect. Her behavior is normal. Thought content normal.    ASSESSMENT/PLAN:  1. Essential hypertension Well controlled - CBC - COMPLETE METABOLIC PANEL WITH GFR - Lipid panel - VITAMIN D 25 Hydroxy (Vit-D Deficiency, Fractures) - Urinalysis, Routine w reflex microscopic  2. Other depression Doing well on  Prozac  3. Other hyperlipidemia Compliant with pravastatin  4. Vitamin D deficiency Taking 2000 units a day over-the-counter  5. Tobacco abuse Trying to quit smoking   Patient Instructions  Continue to reduce the smoking - try to quit Walk every day that you are able Your Blood pressure is good No change in medicines Need flu shot it the fall Need a physical in 6 months, repeat labs at that time     Raylene Everts, MD

## 2016-08-06 NOTE — Telephone Encounter (Signed)
Instructions mailed to pt.

## 2016-08-15 NOTE — Telephone Encounter (Signed)
NO PA is needed for TCS 

## 2016-08-21 ENCOUNTER — Encounter (HOSPITAL_COMMUNITY): Payer: Self-pay

## 2016-08-21 ENCOUNTER — Ambulatory Visit (HOSPITAL_COMMUNITY)
Admission: RE | Admit: 2016-08-21 | Discharge: 2016-08-21 | Disposition: A | Discharge status: Home / Self Care | Payer: Medicare Other | Source: Ambulatory Visit | Attending: Gastroenterology | Admitting: Gastroenterology

## 2016-08-21 ENCOUNTER — Encounter (HOSPITAL_COMMUNITY)
Admission: RE | Disposition: A | Discharge status: Home / Self Care | Payer: Self-pay | Source: Ambulatory Visit | Attending: Gastroenterology

## 2016-08-21 DIAGNOSIS — Z1211 Encounter for screening for malignant neoplasm of colon: Secondary | ICD-10-CM

## 2016-08-21 DIAGNOSIS — K635 Polyp of colon: Secondary | ICD-10-CM | POA: Diagnosis not present

## 2016-08-21 DIAGNOSIS — Z7951 Long term (current) use of inhaled steroids: Secondary | ICD-10-CM | POA: Insufficient documentation

## 2016-08-21 DIAGNOSIS — Z79899 Other long term (current) drug therapy: Secondary | ICD-10-CM | POA: Diagnosis not present

## 2016-08-21 DIAGNOSIS — K648 Other hemorrhoids: Secondary | ICD-10-CM | POA: Insufficient documentation

## 2016-08-21 DIAGNOSIS — F329 Major depressive disorder, single episode, unspecified: Secondary | ICD-10-CM | POA: Insufficient documentation

## 2016-08-21 DIAGNOSIS — Z1212 Encounter for screening for malignant neoplasm of rectum: Secondary | ICD-10-CM

## 2016-08-21 DIAGNOSIS — F419 Anxiety disorder, unspecified: Secondary | ICD-10-CM | POA: Insufficient documentation

## 2016-08-21 DIAGNOSIS — Z7982 Long term (current) use of aspirin: Secondary | ICD-10-CM | POA: Insufficient documentation

## 2016-08-21 DIAGNOSIS — D123 Benign neoplasm of transverse colon: Secondary | ICD-10-CM | POA: Diagnosis not present

## 2016-08-21 DIAGNOSIS — Z791 Long term (current) use of non-steroidal anti-inflammatories (NSAID): Secondary | ICD-10-CM | POA: Diagnosis not present

## 2016-08-21 DIAGNOSIS — E785 Hyperlipidemia, unspecified: Secondary | ICD-10-CM | POA: Diagnosis not present

## 2016-08-21 DIAGNOSIS — I10 Essential (primary) hypertension: Secondary | ICD-10-CM | POA: Diagnosis not present

## 2016-08-21 DIAGNOSIS — K5730 Diverticulosis of large intestine without perforation or abscess without bleeding: Secondary | ICD-10-CM | POA: Diagnosis not present

## 2016-08-21 DIAGNOSIS — K644 Residual hemorrhoidal skin tags: Secondary | ICD-10-CM | POA: Diagnosis not present

## 2016-08-21 DIAGNOSIS — D125 Benign neoplasm of sigmoid colon: Secondary | ICD-10-CM | POA: Diagnosis not present

## 2016-08-21 HISTORY — PX: COLONOSCOPY: SHX5424

## 2016-08-21 HISTORY — PX: POLYPECTOMY: SHX5525

## 2016-08-21 SURGERY — COLONOSCOPY
Anesthesia: Moderate Sedation

## 2016-08-21 MED ORDER — MIDAZOLAM HCL 5 MG/5ML IJ SOLN
INTRAMUSCULAR | Status: DC | PRN
  Administered 2016-08-21 (×2): 2 mg via INTRAVENOUS

## 2016-08-21 MED ORDER — MIDAZOLAM HCL 5 MG/5ML IJ SOLN
INTRAMUSCULAR | Status: AC
  Filled 2016-08-21: qty 10

## 2016-08-21 MED ORDER — SODIUM CHLORIDE 0.9 % IV SOLN
INTRAVENOUS | Status: DC
  Administered 2016-08-21: 09:00:00 via INTRAVENOUS

## 2016-08-21 MED ORDER — MEPERIDINE HCL 100 MG/ML IJ SOLN
INTRAMUSCULAR | Status: DC | PRN
  Administered 2016-08-21: 25 mg via INTRAVENOUS
  Administered 2016-08-21: 50 mg via INTRAVENOUS

## 2016-08-21 MED ORDER — MEPERIDINE HCL 100 MG/ML IJ SOLN
INTRAMUSCULAR | Status: AC
  Filled 2016-08-21: qty 2

## 2016-08-21 NOTE — Op Note (Signed)
[facility] Patient Name: Victoria Graves Procedure Date: 08/21/2016 9:29 AM MRN: [id number] Date of Birth: [birth year] Attending MD: Barney Drain , MD CSN: [id number] Age: 61 Admit Type: Outpatient Procedure:                Colonoscopy WITH COLD SNARE/SNARE CAUTERY                            POLYPECTOMY Indications:              Screening for colorectal malignant neoplasm Providers:                Barney Drain, MD, Lurline Del, RN, Randa Spike,                            Technician Referring MD:             Lysle Morales Medicines:                Meperidine 75 mg IV, Midazolam 4 mg IV Complications:            No immediate complications. Estimated Blood Loss:     Estimated blood loss was minimal. Procedure:                Pre-Anesthesia Assessment:                           - Prior to the procedure, a History and Physical                            was performed, and patient medications and                            allergies were reviewed. The patient's tolerance of                            previous anesthesia was also reviewed. The risks                            and benefits of the procedure and the sedation                            options and risks were discussed with the patient.                            All questions were answered, and informed consent                            was obtained. Prior Anticoagulants: The patient has                            taken aspirin, last dose was 1 day prior to                            procedure. ASA Grade Assessment: II - A patient  with mild systemic disease. After reviewing the                            risks and benefits, the patient was deemed in                            satisfactory condition to undergo the procedure.                            After obtaining informed consent, the colonoscope                            was passed under direct vision. Throughout the    procedure, the patient's blood pressure, pulse, and                            oxygen saturations were monitored continuously. The                            EC-[id number] ([id number]) scope was introduced through                            the anus and advanced to the the cecum, identified                            by appendiceal orifice and ileocecal valve. The                            ileocecal valve, appendiceal orifice, and rectum                            were photographed. The colonoscopy was somewhat                            difficult due to a tortuous colon. Successful                            completion of the procedure was aided by COLOWRAP.                            The patient tolerated the procedure fairly well.                            The quality of the bowel preparation was excellent. Scope In: 10:02:25 AM Scope Out: 10:19:56 AM Scope Withdrawal Time: 0 hours 15 minutes 32 seconds  Total Procedure Duration: 0 hours 17 minutes 31 seconds  Findings:      A 4 mm polyp was found in the hepatic flexure. The polyp was sessile.       The polyp was removed with a cold snare. Resection and retrieval were       complete.      Two sessile polyps were found in the sigmoid colon. The polyps were 5 to       7 mm in size. These polyps were removed with a  hot snare. Resection and       retrieval were complete.      Multiple large-mouthed diverticula were found in the entire colon.      External hemorrhoids were found during retroflexion and during digital       exam. The hemorrhoids were large.      Internal hemorrhoids were found during retroflexion. The hemorrhoids       were small. Impression:               - One 4 mm polyp at the hepatic flexure, removed                            with a cold snare. Resected and retrieved.                           - Two 5 to 7 mm polyps in the sigmoid colon,                            removed with a hot snare. Resected and retrieved.                            - Diverticulosis in the entire examined colon.                           - External hemorrhoids.                           - Internal hemorrhoids. Moderate Sedation:      Moderate (conscious) sedation was administered by the endoscopy nurse       and supervised by the endoscopist. The following parameters were       monitored: oxygen saturation, heart rate, blood pressure, and response       to care. Total physician intraservice time was 30 minutes. Recommendation:           - Repeat colonoscopy in 3 - 5 years for                            surveillance.                           - High fiber diet.                           - Continue present medications.                           - Await pathology results.                           - Patient has a contact number available for                            emergencies. The signs and symptoms of potential                            delayed complications were discussed with the  patient. Return to normal activities tomorrow.                            Written discharge instructions were provided to the                            patient. Procedure Code(s):        --- Professional ---                           [phone], Colonoscopy, flexible; with removal of                            tumor(s), polyp(s), or other lesion(s) by snare                            technique                           99152, Moderate sedation services provided by the                            same physician or other qualified health care                            professional performing the diagnostic or                            therapeutic service that the sedation supports,                            requiring the presence of an independent trained                            observer to assist in the monitoring of the                            patient's level of consciousness and physiological                             status; initial 15 minutes of intraservice time,                            patient age 23 years or older                           [phone], Moderate sedation services; each additional                            15 minutes intraservice time Diagnosis Code(s):        --- Professional ---                           Z12.11, Encounter for screening for malignant  neoplasm of colon                           D12.3, Benign neoplasm of transverse colon (hepatic                            flexure or splenic flexure)                           D12.5, Benign neoplasm of sigmoid colon                           K64.4, Residual hemorrhoidal skin tags                           K64.8, Other hemorrhoids                           K57.30, Diverticulosis of large intestine without                            perforation or abscess without bleeding CPT copyright 2016 [employer]. All rights reserved. The codes documented in this report are preliminary and upon coder review may  be revised to meet current compliance requirements. Barney Drain, MD Barney Drain, MD 08/21/2016 10:33:46 AM This report has been signed electronically. Number of Addenda: 0

## 2016-08-21 NOTE — H&P (Signed)
Primary Care Physician:  Raylene Everts, MD Primary Gastroenterologist:  Dr. Oneida Alar  Pre-Procedure History & Physical: HPI:  Victoria Graves is a 61 y.o. female here for [place].  Past Medical History:  Diagnosis Date  . Allergy   . Anxiety   . Arthritis   . Depression   . GERD (gastroesophageal reflux disease)   . Hyperlipidemia   . Hypertension     Past Surgical History:  Procedure Laterality Date  . ABDOMINAL HYSTERECTOMY  1999   fibroid    Prior to Admission medications   Medication Sig Start Date End Date Taking? Authorizing Provider  aspirin EC 81 MG tablet Take 81 mg by mouth daily.   Yes [provider]  BLACK COHOSH EXTRACT PO Take 540 mg by mouth daily.    Yes [provider]  Cholecalciferol (VITAMIN D) 2000 units CAPS Take 2,000 Units by mouth daily.   Yes [provider]  FLUoxetine (PROZAC) 20 MG capsule Take 1 capsule (20 mg total) by mouth daily. 07/24/16  Yes Raylene Everts, MD  fluticasone [facility]) 50 MCG/ACT nasal spray Place 1 spray into both nostrils daily as needed for allergies or rhinitis.   Yes [provider]  losartan-hydrochlorothiazide (HYZAAR) 50-12.5 MG tablet Take 1 tablet by mouth daily. 07/24/16  Yes Raylene Everts, MD  Na Sulfate-K Sulfate-Mg Sulf (SUPREP BOWEL PREP KIT) 17.5-3.13-1.6 GM/180ML SOLN Take 1 kit by mouth as directed. 07/30/16  Yes Kaushal Vannice L, MD  naproxen (NAPROSYN) 500 MG tablet Take 1 tablet (500 mg total) by mouth 2 (two) times daily as needed. Patient taking differently: Take 500 mg by mouth daily as needed for moderate pain.  07/24/16  Yes Raylene Everts, MD  pravastatin (PRAVACHOL) 40 MG tablet Take 1 tablet (40 mg total) by mouth daily. 07/24/16  Yes Raylene Everts, MD  Tetrahydrozoline HCl (VISINE OP) Apply 1 drop to eye daily as needed (dry eyes).   Yes [provider]    Allergies as of 07/11/2016 - Review Complete 07/09/2016  Allergen  Reaction Noted  . Lisinopril Cough 11/01/2015  . Penicillins Rash 11/27/2012    Family History  Problem Relation Age of Onset  . Hypertension Mother   . Arthritis Mother   . Heart disease Maternal Grandmother   . Kidney disease Maternal Grandmother     Social History   Social History  . Marital status: Divorced    Spouse name: N/A  . Number of children: 0  . Years of education: 10   Occupational History  . disability     back and hip   Social History Main Topics  . Smoking status: Current Every Day Smoker    Packs/day: 0.10    Types: Cigarettes  . Smokeless tobacco: Never Used  . Alcohol use Yes     Comment: occasional  . Drug use: No  . Sexual activity: Yes    Birth control/ protection: Surgical   Other Topics Concern  . Not on file   Social History Narrative   Lives with friend and cousin who has mental disability    Review of Systems: See HPI, otherwise negative ROS   Physical Exam: BP (!) 139/59   Pulse 72   Temp 98.7 F (37.1 C) (Oral)   Resp 20   SpO2 96%  General:   Alert,  pleasant and cooperative in NAD Head:  Normocephalic and atraumatic. Neck:  Supple; Lungs:  Clear throughout to auscultation.    Heart:  Regular rate and rhythm. Abdomen:  Soft, nontender and nondistended. Normal bowel sounds, without guarding, and without rebound.   Neurologic:  Alert and  oriented x4;  grossly normal neurologically.  Impression/Plan:     SCREENING  Plan:  1. TCS TODAY. DISCUSSED PROCEDURE, BENEFITS, & RISKS: < 1% chance of medication reaction, bleeding, perforation, or rupture of spleen/liver.

## 2016-08-21 NOTE — Discharge Instructions (Signed)
You have small internal AND LARGE EXTERNAL HEMORRHOIDS and diverticulosis IN YOUR LEFT AND RIGHT COLON. YOU HAD THREE POLYPS REMOVED.   DRINK WATER TO KEEP YOUR URINE LIGHT YELLOW.  FOLLOW A HIGH FIBER DIET. AVOID ITEMS THAT CAUSE BLOATING. See info below.YOU SHOULD TRANSITION TO A PLANT BASED DIET-NO MEAT OR DAIRY for 6 MOS. I RECOMMEND THE BOOK, "PREVENT AND REVERSE HEART DISEASE", CALDWELL ESSELSTYN JR., MD. PAGES 120-121 [place] THE DIET AND THE LAST HALF OF THE BOOK HAS QUICK AND EASY RECIPES FOR BREAKFAST, LUNCH, AND DINNER ARE AFTER PAGE 127.   CONTINUE YOUR WEIGHT LOSS EFFORTS.  WHILE I DO NOT WANT TO ALARM YOU, YOUR BODY MASS INDEX (BMI) IS OVER 40 WHICH MEANS YOU ARE MORBIDLY OBESE. OBESITY ACTIVATES CANCER GENES. MORBID OBESITY SHORTENS YOUR LIFE EXPECTANCY 10 YEARS. OBESITY IS ASSOCIATED WITH AN INCREASE RISK FOR ALL CANCERS, INCLUDING ESOPHAGEAL AND COLON CANCER. A WEIGHT OF 135 LBS  WILL GET YOUR BMI UNDER 30.  YOUR BIOPSY RESULTS WILL BE AVAILABLE IN MY CHART AFTER JUL 28   AND MY OFFICE WILL CONTACT YOU IN 10-14 DAYS WITH YOUR RESULTS.   Next colonoscopy in 3-5 years.  Colonoscopy Care After Read the instructions outlined below and refer to this sheet in the next week. These discharge instructions provide you with general information on caring for yourself after you leave the hospital. While your treatment has been planned according to the most current medical practices available, unavoidable complications occasionally occur. If you have any problems or questions after discharge, call DR. Sontee Desena, [phone].  ACTIVITY  You may resume your regular activity, but move at a slower pace for the next 24 hours.   Take frequent rest periods for the next 24 hours.   Walking will help get rid of the air and reduce the bloated feeling in your belly (abdomen).   No driving for 24 hours (because of the medicine (anesthesia) used during the test).   You may shower.     Do not sign any important legal documents or operate any machinery for 24 hours (because of the anesthesia used during the test).    NUTRITION  Drink plenty of fluids.   You may resume your normal diet as instructed by your doctor.   Begin with a light meal and progress to your normal diet. Heavy or fried foods are harder to digest and may make you feel sick to your stomach (nauseated).   Avoid alcoholic beverages for 24 hours or as instructed.    MEDICATIONS  You may resume your normal medications.   WHAT YOU CAN EXPECT TODAY  Some feelings of bloating in the abdomen.   Passage of more gas than usual.   Spotting of blood in your stool or on the toilet paper  .  IF YOU HAD POLYPS REMOVED DURING THE COLONOSCOPY:  Eat a soft diet IF YOU HAVE NAUSEA, BLOATING, ABDOMINAL PAIN, OR VOMITING.    FINDING OUT THE RESULTS OF YOUR TEST Not all test results are available during your visit. DR. Oneida Alar WILL CALL YOU WITHIN 14 DAYS OF YOUR PROCEDUE WITH YOUR RESULTS. Do not assume everything is normal if you have not heard from DR. Etheleen Valtierra, CALL HER OFFICE AT [phone].  SEEK IMMEDIATE MEDICAL ATTENTION AND CALL THE OFFICE: [phone] IF:  You have more than a spotting of blood in your stool.   Your belly is swollen (abdominal distention).   You are nauseated or vomiting.   You have a temperature over  101F.   You have abdominal pain or discomfort that is severe or gets worse throughout the day.  High-Fiber Diet A high-fiber diet changes your normal diet to include more whole grains, legumes, fruits, and vegetables. Changes in the diet involve replacing refined carbohydrates with unrefined foods. The calorie level of the diet is essentially unchanged. The Dietary Reference Intake (recommended amount) for adult males is 38 grams per day. For adult females, it is 25 grams per day. Pregnant and lactating women should consume 28 grams of fiber per day. Fiber is the intact part  of a plant that is not broken down during digestion. Functional fiber is fiber that has been isolated from the plant to provide a beneficial effect in the body. PURPOSE  Increase stool bulk.   Ease and regulate bowel movements.   Lower cholesterol.   REDUCE RISK OF COLON CANCER  INDICATIONS THAT YOU NEED MORE FIBER  Constipation and hemorrhoids.   Uncomplicated diverticulosis (intestine condition) and irritable bowel syndrome.   Weight management.   As a protective measure against hardening of the arteries (atherosclerosis), diabetes, and cancer.   GUIDELINES FOR INCREASING FIBER IN THE DIET  Start adding fiber to the diet slowly. A gradual increase of about 5 more grams (2 slices of whole-wheat bread, 2 servings of most fruits or vegetables, or 1 bowl of high-fiber cereal) per day is best. Too rapid an increase in fiber may result in constipation, flatulence, and bloating.   Drink enough water and fluids to keep your urine clear or pale yellow. Water, juice, or caffeine-free drinks are recommended. Not drinking enough fluid may cause constipation.   Eat a variety of high-fiber foods rather than one type of fiber.   Try to increase your intake of fiber through using high-fiber foods rather than fiber pills or supplements that contain small amounts of fiber.   The goal is to change the types of food eaten. Do not supplement your present diet with high-fiber foods, but replace foods in your present diet.   INCLUDE A VARIETY OF FIBER SOURCES  Replace refined and processed grains with whole grains, canned fruits with fresh fruits, and incorporate other fiber sources. White rice, white breads, and most bakery goods contain little or no fiber.   Brown whole-grain rice, buckwheat oats, and many fruits and vegetables are all good sources of fiber. These include: broccoli, Brussels sprouts, cabbage, cauliflower, beets, sweet potatoes, white potatoes (skin on), carrots, tomatoes, eggplant,  squash, berries, fresh fruits, and dried fruits.   Cereals appear to be the richest source of fiber. Cereal fiber is found in whole grains and bran. Bran is the fiber-rich outer coat of cereal grain, which is largely removed in refining. In whole-grain cereals, the bran remains. In breakfast cereals, the largest amount of fiber is found in those with "bran" in their names. The fiber content is sometimes indicated on the label.   You may need to include additional fruits and vegetables each day.   In baking, for 1 cup white flour, you may use the following substitutions:   1 cup whole-wheat flour minus 2 tablespoons.   1/2 cup white flour plus 1/2 cup whole-wheat flour.   Polyps, Colon  A polyp is extra tissue that grows inside your body. Colon polyps grow in the large intestine. The large intestine, also called the colon, is part of your digestive system. It is a long, hollow tube at the end of your digestive tract where your body makes and stores stool. Most  polyps are not dangerous. They are benign. This means they are not cancerous. But over time, some types of polyps can turn into cancer. Polyps that are smaller than a pea are usually not harmful. But larger polyps could someday become or may already be cancerous. To be safe, doctors remove all polyps and test them.   WHO GETS POLYPS? Anyone can get polyps, but certain people are more likely than others. You may have a greater chance of getting polyps if:  You are over 50.   You have had polyps before.   Someone in your family has had polyps.   Someone in your family has had cancer of the large intestine.   Find out if someone in your family has had polyps. You may also be more likely to get polyps if you:   Eat a lot of fatty foods   Smoke   Drink alcohol   Do not exercise  Eat too much     PREVENTION There is not one sure way to prevent polyps. You might be able to lower your risk of getting them if you:  Eat more fruits  and vegetables and less fatty food.   Do not smoke.   Avoid alcohol.   Exercise every day.   Lose weight if you are overweight.   Eating more calcium and folate can also lower your risk of getting polyps. Some foods that are rich in calcium are milk, cheese, and broccoli. Some foods that are rich in folate are chickpeas, kidney beans, and spinach.    Diverticulosis Diverticulosis is a common condition that develops when small pouches (diverticula) form in the wall of the colon. The risk of diverticulosis increases with age. It happens more often in people who eat a low-fiber diet. Most individuals with diverticulosis have no symptoms. Those individuals with symptoms usually experience belly (abdominal) pain, constipation, or loose stools (diarrhea).  HOME CARE INSTRUCTIONS  Increase the amount of fiber in your diet as directed by your caregiver or dietician. This may reduce symptoms of diverticulosis.   Drink at least 6 to 8 glasses of water each day to prevent constipation.   Try not to strain when you have a bowel movement.   Avoiding nuts and seeds to prevent complications is NOT NECESSARY.   FOODS HAVING HIGH FIBER CONTENT INCLUDE:  Fruits. Apple, peach, pear, tangerine, raisins, prunes.   Vegetables. Brussels sprouts, asparagus, broccoli, cabbage, carrot, cauliflower, romaine lettuce, spinach, summer squash, tomato, winter squash, zucchini.   Starchy Vegetables. Baked beans, kidney beans, lima beans, split peas, lentils, potatoes (with skin).   Grains. Whole wheat bread, brown rice, bran flake cereal, plain oatmeal, white rice, shredded wheat, bran muffins.    SEEK IMMEDIATE MEDICAL CARE IF:  You develop increasing pain or severe bloating.   You have an oral temperature above 101F.   You develop vomiting or bowel movements that are bloody or black.   Hemorrhoids Hemorrhoids are dilated (enlarged) veins around the rectum. Sometimes clots will form in the veins. This  makes them swollen and painful. These are called thrombosed hemorrhoids. Causes of hemorrhoids include:  Constipation.   Straining to have a bowel movement.   HEAVY LIFTING  HOME CARE INSTRUCTIONS  Eat a well balanced diet and drink 6 to 8 glasses of water every day to avoid constipation. You may also use a bulk laxative.   Avoid straining to have bowel movements.   Keep anal area dry and clean.   Do not use a donut  shaped pillow or sit on the toilet for long periods. This increases blood pooling and pain.   Move your bowels when your body has the urge; this will require less straining and will decrease pain and pressure.

## 2016-08-22 ENCOUNTER — Encounter: Payer: Self-pay | Admitting: Family Medicine

## 2016-08-22 ENCOUNTER — Telehealth: Payer: Self-pay | Admitting: Gastroenterology

## 2016-08-22 DIAGNOSIS — D126 Benign neoplasm of colon, unspecified: Secondary | ICD-10-CM | POA: Insufficient documentation

## 2016-08-22 NOTE — Telephone Encounter (Signed)
PT is aware.

## 2016-08-22 NOTE — Telephone Encounter (Signed)
Please call pt. SHE had ONE simple adenoma AND TWO HYPERPLASTIC POLYPS REMOVED. FOLLOW A High fiber diet. NEXT TCS IN 5-10 YEARS.

## 2016-08-23 ENCOUNTER — Encounter (HOSPITAL_COMMUNITY): Payer: Self-pay | Admitting: Gastroenterology

## 2016-08-23 NOTE — Telephone Encounter (Signed)
Reminder in epic °

## 2016-10-30 NOTE — Telephone Encounter (Signed)
Issue resolved.

## 2016-12-25 ENCOUNTER — Telehealth: Payer: Self-pay | Admitting: *Deleted

## 2016-12-25 MED ORDER — NAPROXEN 500 MG PO TABS: 500.0000 mg | ORAL_TABLET | Freq: Two times a day (BID) | ORAL | 2 refills | Status: DC | PRN

## 2016-12-25 NOTE — Telephone Encounter (Signed)
Patent called requesting her naproxen to be refilled to [facility] in [place], it was last filled by old pcp Dr Cindie Laroche. Please advise

## 2016-12-25 NOTE — Telephone Encounter (Signed)
Seen [phone]

## 2017-02-06 ENCOUNTER — Other Ambulatory Visit: Payer: Self-pay

## 2017-02-06 ENCOUNTER — Encounter: Payer: Self-pay | Admitting: Family Medicine

## 2017-02-06 ENCOUNTER — Ambulatory Visit (INDEPENDENT_AMBULATORY_CARE_PROVIDER_SITE_OTHER): Payer: Medicare Other | Admitting: Family Medicine

## 2017-02-06 VITALS — BP 122/78 | HR 72 | Temp 98.9°F | Resp 18 | Ht <= 58 in | Wt 187.1 lb

## 2017-02-06 DIAGNOSIS — Z0000 Encounter for general adult medical examination without abnormal findings: Secondary | ICD-10-CM | POA: Diagnosis not present

## 2017-02-06 DIAGNOSIS — E7849 Other hyperlipidemia: Secondary | ICD-10-CM | POA: Diagnosis not present

## 2017-02-06 DIAGNOSIS — I10 Essential (primary) hypertension: Secondary | ICD-10-CM | POA: Diagnosis not present

## 2017-02-06 DIAGNOSIS — B353 Tinea pedis: Secondary | ICD-10-CM

## 2017-02-06 DIAGNOSIS — B352 Tinea manuum: Secondary | ICD-10-CM

## 2017-02-06 DIAGNOSIS — Z23 Encounter for immunization: Secondary | ICD-10-CM | POA: Diagnosis not present

## 2017-02-06 DIAGNOSIS — E559 Vitamin D deficiency, unspecified: Secondary | ICD-10-CM

## 2017-02-06 MED ORDER — TERBINAFINE HCL 250 MG PO TABS: 250.0000 mg | ORAL_TABLET | Freq: Every day | ORAL | 1 refills | Status: DC

## 2017-02-06 NOTE — Patient Instructions (Signed)
Lab tests today Flu shot today  Take the terbinafine once a day for 2 weeks Wait a week or two to see if rash clears up May repeat if needed ( refill authorized) If this does NOT work, call for other advice  See me in 6 months

## 2017-02-06 NOTE — Progress Notes (Signed)
Chief Complaint  Patient presents with  . Annual Exam   Patient is here for her annual physical examination Pap not indicated due to hysterectomy Mammogram was done last year Colonoscopy was done last year Immunizations up-to-date.  Flu shot done today She is due for lab work today.  She is reminded to go get this done Well-controlled hypertension.  Compliant with medications Compliant with her hyperlipidemia statin, will check lipid panel Reviewed diet and exercise.  She is overweight.  She is only lost 1 pound since 6 months ago.  She does not get regular exercise.  Is reminded of the benefit of her losing weight, reducing risk, regular exercise No recent eye exam No recent dental exam  In addition to her physical examination she has a new complaint today.  She has a rash on her hands and feet for 2 - 3 months.  It itches.  It starts off looking like little blisters, then turns into scalp.  It is on her palms, soles, and the sides of her feet and fingers/hands.  No rash elsewhere.  Generally dry skin.  No prior skin infection such as tinea.  No prior diagnosis of eczema.  No new soap lotion powder or product used on the skin.  Patient Active Problem List   Diagnosis Date Noted  . Tubular adenoma of colon 08/22/2016  . Special screening for malignant neoplasms, colon   . Vitamin D deficiency 01/31/2016  . Tobacco abuse 11/01/2015  . Depression 11/01/2015  . HTN (hypertension) 11/01/2015  . HLD (hyperlipidemia) 11/01/2015  . Degenerative joint disease (DJD) of lumbar spine 11/01/2015  . Osteoarthritis of left hip 11/01/2015    Outpatient Encounter Medications as of 02/06/2017  Medication Sig  . aspirin EC 81 MG tablet Take 81 mg by mouth daily.  Marland Kitchen BLACK COHOSH EXTRACT PO Take 540 mg by mouth daily.   . Cholecalciferol (VITAMIN D) 2000 units CAPS Take 2,000 Units by mouth daily.  Marland Kitchen FLUoxetine (PROZAC) 20 MG capsule Take 1 capsule (20 mg total) by mouth daily.  . fluticasone  (FLONASE) 50 MCG/ACT nasal spray Place 1 spray into both nostrils daily as needed for allergies or rhinitis.  Marland Kitchen losartan-hydrochlorothiazide (HYZAAR) 50-12.5 MG tablet Take 1 tablet by mouth daily.  . naproxen (NAPROSYN) 500 MG tablet Take 1 tablet (500 mg total) by mouth 2 (two) times daily as needed.  . pravastatin (PRAVACHOL) 40 MG tablet Take 1 tablet (40 mg total) by mouth daily.  . Tetrahydrozoline HCl (VISINE OP) Apply 1 drop to eye daily as needed (dry eyes).  . terbinafine (LAMISIL) 250 MG tablet Take 1 tablet (250 mg total) by mouth daily.   No facility-administered encounter medications on file as of 02/06/2017.     Allergies  Allergen Reactions  . Lisinopril Cough  . Penicillins Rash    Has patient had a PCN reaction causing immediate rash, facial/tongue/throat swelling, SOB or lightheadedness with hypotension: Yes Has patient had a PCN reaction causing severe rash involving mucus membranes or skin necrosis: No Has patient had a PCN reaction that required hospitalization: No Has patient had a PCN reaction occurring within the last 10 years: No If all of the above answers are "NO", then may proceed with Cephalosporin use.     Review of Systems  Constitutional: Negative for activity change, appetite change and unexpected weight change.  HENT: Negative for congestion, dental problem, postnasal drip and rhinorrhea.   Eyes: Negative for redness and visual disturbance.  Respiratory: Negative for cough  and shortness of breath.   Cardiovascular: Negative for chest pain, palpitations and leg swelling.  Gastrointestinal: Negative for abdominal pain, constipation and diarrhea.  Genitourinary: Negative for difficulty urinating and frequency.  Musculoskeletal: Positive for arthralgias and back pain.       Occasional  Skin: Positive for rash.       See HPI  Neurological: Negative for dizziness and headaches.  Psychiatric/Behavioral: Negative for dysphoric mood and sleep disturbance.  The patient is not nervous/anxious.     BP 122/78 (BP Location: Right Arm, Patient Position: Sitting, Cuff Size: Normal)   Pulse 72   Temp 98.9 F (37.2 C) (Temporal)   Resp 18   Ht 4\' 9"  (1.448 m)   Wt 187 lb 1.3 oz (84.9 kg)   SpO2 100%   BMI 40.48 kg/m   Physical Exam   BP 122/78 (BP Location: Right Arm, Patient Position: Sitting, Cuff Size: Normal)   Pulse 72   Temp 98.9 F (37.2 C) (Temporal)   Resp 18   Ht 4\' 9"  (1.448 m)   Wt 187 lb 1.3 oz (84.9 kg)   SpO2 100%   BMI 40.48 kg/m   General Appearance:    Alert, cooperative, no distress, appears stated age. Overweight.  Head:    Normocephalic, without obvious abnormality, atraumatic  Eyes:    PERRL, conjunctiva/corneas clear, EOM's intact, fundi    benign, both eyes  Ears:    Normal TM's and external ear canals, both ears  Nose:   Nares normal, septum midline, mucosa normal, no drainage    or sinus tenderness  Throat:   Lips, mucosa, and tongue normal; teeth and gums normal  Neck:   Supple, symmetrical, trachea midline, no adenopathy;    thyroid:  no enlargement/tenderness/nodules; no carotid   bruit  Back:     Symmetric, no curvature, ROM normal, no CVA tenderness  Lungs:     Clear to auscultation bilaterally, respirations unlabored  Chest Wall:    No tenderness or deformity   Heart:    Regular rate and rhythm, S1 and S2 normal, no murmur, rub   or gallop  Breast Exam:    No tenderness, masses, or nipple abnormality  Abdomen:     Soft, non-tender, bowel sounds active all four quadrants,    no masses, no organomegaly  Extremities:   Extremities normal, atraumatic, no cyanosis or edema  Pulses:   2+ and symmetric all extremities  Skin:   Skin color, texture, dry and rough in general, turgor normal, no lesions.  Hands and feet including palms and soles with small circular peeling areas and few vesicles  Lymph nodes:   Cervical, supraclavicular, and axillary nodes normal  Neurologic:   Normal strength, sensation and  reflexes    throughout     ASSESSMENT/PLAN:  1. Essential hypertension Controlled - COMPLETE METABOLIC PANEL WITH GFR - CBC - Urinalysis, Routine w reflex microscopic  2. Tinea manus Treat with terbinafine  3. Tinea pedis of both feet As above  4. Other hyperlipidemia Due for lab testing - Lipid panel  5. Vitamin D deficiency History of - VITAMIN D 25 Hydroxy (Vit-D Deficiency, Fractures)  6. Need for influenza vaccination Done - Flu Vaccine QUAD 36+ mos IM  7. PE (physical exam), annual No unexpected findings, except for the tinea.  Treated Health maintenance is up-to-date. Counseled regarding weight, diet, exercise. Counseled to quit smoking.  She smokes 2 or 3 cigarettes a day.  I know this is light, but still of risk.  Patient Instructions  Lab tests today Flu shot today  Take the terbinafine once a day for 2 weeks Wait a week or two to see if rash clears up May repeat if needed ( refill authorized) If this does NOT work, call for other advice  See me in 6 months   Raylene Everts, MD

## 2017-02-07 ENCOUNTER — Encounter: Payer: Self-pay | Admitting: Family Medicine

## 2017-02-07 LAB — CBC
HEMATOCRIT: 41.1 % (ref 35.0–45.0)
Hemoglobin: 13.9 g/dL (ref 11.7–15.5)
MCH: 28.1 pg (ref 27.0–33.0)
MCHC: 33.8 g/dL (ref 32.0–36.0)
MCV: 83.2 fL (ref 80.0–100.0)
MPV: 10.3 fL (ref 7.5–12.5)
Platelets: 242 10*3/uL (ref 140–400)
RBC: 4.94 10*6/uL (ref 3.80–5.10)
RDW: 12.8 % (ref 11.0–15.0)
WBC: 4.6 10*3/uL (ref 3.8–10.8)

## 2017-02-07 LAB — COMPLETE METABOLIC PANEL WITH GFR
AG Ratio: 1.9 (calc) (ref 1.0–2.5)
ALBUMIN MSPROF: 4.2 g/dL (ref 3.6–5.1)
ALKALINE PHOSPHATASE (APISO): 58 U/L (ref 33–130)
ALT: 9 U/L (ref 6–29)
AST: 14 U/L (ref 10–35)
BUN/Creatinine Ratio: 14 (calc) (ref 6–22)
BUN: 14 mg/dL (ref 7–25)
CO2: 29 mmol/L (ref 20–32)
CREATININE: 1.02 mg/dL — AB (ref 0.50–0.99)
Calcium: 9.6 mg/dL (ref 8.6–10.4)
Chloride: 105 mmol/L (ref 98–110)
GFR, Est African American: 69 mL/min/{1.73_m2} (ref >=60)
GFR, Est Non African American: 60 mL/min/{1.73_m2} (ref >=60)
GLOBULIN: 2.2 g/dL (ref 1.9–3.7)
Glucose, Bld: 90 mg/dL (ref 65–99)
Potassium: 4.3 mmol/L (ref 3.5–5.3)
SODIUM: 139 mmol/L (ref 135–146)
Total Bilirubin: 0.4 mg/dL (ref 0.2–1.2)
Total Protein: 6.4 g/dL (ref 6.1–8.1)

## 2017-02-07 LAB — LIPID PANEL
CHOL/HDL RATIO: 1.9 (calc) (ref <5.0)
Cholesterol: 150 mg/dL (ref <200)
HDL: 80 mg/dL (ref >50)
LDL Cholesterol (Calc): 57 mg/dL (calc)
NON-HDL CHOLESTEROL (CALC): 70 mg/dL (ref <130)
Triglycerides: 54 mg/dL (ref <150)

## 2017-02-07 LAB — URINALYSIS, ROUTINE W REFLEX MICROSCOPIC
Bilirubin Urine: NEGATIVE
Glucose, UA: NEGATIVE
HGB URINE DIPSTICK: NEGATIVE
Ketones, ur: NEGATIVE
LEUKOCYTES UA: NEGATIVE
NITRITE: NEGATIVE
PROTEIN: NEGATIVE
Specific Gravity, Urine: 1.015 (ref 1.001–1.03)
pH: 6 (ref 5.0–8.0)

## 2017-02-07 LAB — VITAMIN D 25 HYDROXY (VIT D DEFICIENCY, FRACTURES): VIT D 25 HYDROXY: 44 ng/mL (ref 30–100)

## 2017-04-07 ENCOUNTER — Encounter: Payer: Self-pay | Admitting: Family Medicine

## 2017-06-13 ENCOUNTER — Other Ambulatory Visit: Payer: Self-pay | Admitting: Family Medicine

## 2017-06-19 DIAGNOSIS — B353 Tinea pedis: Secondary | ICD-10-CM | POA: Diagnosis not present

## 2017-06-19 DIAGNOSIS — G5790 Unspecified mononeuropathy of unspecified lower limb: Secondary | ICD-10-CM | POA: Diagnosis not present

## 2017-06-19 DIAGNOSIS — S93331D Other subluxation of right foot, subsequent encounter: Secondary | ICD-10-CM | POA: Diagnosis not present

## 2017-06-19 DIAGNOSIS — S93332D Other subluxation of left foot, subsequent encounter: Secondary | ICD-10-CM | POA: Diagnosis not present

## 2017-07-24 DIAGNOSIS — M179 Osteoarthritis of knee, unspecified: Secondary | ICD-10-CM | POA: Diagnosis not present

## 2017-07-24 DIAGNOSIS — E782 Mixed hyperlipidemia: Secondary | ICD-10-CM | POA: Diagnosis not present

## 2017-07-24 DIAGNOSIS — I10 Essential (primary) hypertension: Secondary | ICD-10-CM | POA: Diagnosis not present

## 2017-08-04 ENCOUNTER — Other Ambulatory Visit (HOSPITAL_COMMUNITY): Payer: Self-pay | Admitting: Internal Medicine

## 2017-08-04 DIAGNOSIS — Z1231 Encounter for screening mammogram for malignant neoplasm of breast: Secondary | ICD-10-CM

## 2017-08-07 ENCOUNTER — Ambulatory Visit: Payer: Medicare Other | Admitting: Family Medicine

## 2017-08-08 ENCOUNTER — Ambulatory Visit (HOSPITAL_COMMUNITY)
Admission: RE | Admit: 2017-08-08 | Discharge: 2017-08-08 | Disposition: A | Discharge status: Home / Self Care | Payer: Medicare Other | Source: Ambulatory Visit | Attending: Internal Medicine | Admitting: Internal Medicine

## 2017-08-08 ENCOUNTER — Encounter (HOSPITAL_COMMUNITY): Payer: Self-pay

## 2017-08-08 DIAGNOSIS — Z1231 Encounter for screening mammogram for malignant neoplasm of breast: Secondary | ICD-10-CM | POA: Diagnosis not present

## 2017-08-12 ENCOUNTER — Other Ambulatory Visit (HOSPITAL_COMMUNITY): Payer: Self-pay | Admitting: Internal Medicine

## 2017-08-12 DIAGNOSIS — N6310 Unspecified lump in the right breast, unspecified quadrant: Secondary | ICD-10-CM

## 2017-08-19 ENCOUNTER — Ambulatory Visit (HOSPITAL_COMMUNITY)
Admission: RE | Admit: 2017-08-19 | Discharge: 2017-08-19 | Disposition: A | Discharge status: Home / Self Care | Payer: Medicare Other | Source: Ambulatory Visit | Attending: Internal Medicine | Admitting: Internal Medicine

## 2017-08-19 ENCOUNTER — Encounter (HOSPITAL_COMMUNITY): Payer: Self-pay

## 2017-08-19 DIAGNOSIS — N6310 Unspecified lump in the right breast, unspecified quadrant: Secondary | ICD-10-CM | POA: Insufficient documentation

## 2017-08-19 DIAGNOSIS — R928 Other abnormal and inconclusive findings on diagnostic imaging of breast: Secondary | ICD-10-CM | POA: Diagnosis not present

## 2017-08-19 DIAGNOSIS — N6001 Solitary cyst of right breast: Secondary | ICD-10-CM | POA: Diagnosis not present

## 2017-09-09 ENCOUNTER — Other Ambulatory Visit: Payer: Self-pay | Admitting: Family Medicine

## 2017-09-12 DIAGNOSIS — Z1329 Encounter for screening for other suspected endocrine disorder: Secondary | ICD-10-CM | POA: Diagnosis not present

## 2017-09-12 DIAGNOSIS — E782 Mixed hyperlipidemia: Secondary | ICD-10-CM | POA: Diagnosis not present

## 2017-09-12 DIAGNOSIS — I10 Essential (primary) hypertension: Secondary | ICD-10-CM | POA: Diagnosis not present

## 2017-09-21 ENCOUNTER — Other Ambulatory Visit: Payer: Self-pay | Admitting: Family Medicine

## 2017-09-26 ENCOUNTER — Other Ambulatory Visit: Payer: Self-pay | Admitting: Family Medicine

## 2017-10-03 DIAGNOSIS — M179 Osteoarthritis of knee, unspecified: Secondary | ICD-10-CM | POA: Diagnosis not present

## 2017-10-03 DIAGNOSIS — E782 Mixed hyperlipidemia: Secondary | ICD-10-CM | POA: Diagnosis not present

## 2017-10-03 DIAGNOSIS — I10 Essential (primary) hypertension: Secondary | ICD-10-CM | POA: Diagnosis not present

## 2017-10-03 DIAGNOSIS — E870 Hyperosmolality and hypernatremia: Secondary | ICD-10-CM | POA: Diagnosis not present

## 2017-10-03 DIAGNOSIS — E559 Vitamin D deficiency, unspecified: Secondary | ICD-10-CM | POA: Diagnosis not present

## 2017-11-06 DIAGNOSIS — Z23 Encounter for immunization: Secondary | ICD-10-CM | POA: Diagnosis not present

## 2017-12-03 ENCOUNTER — Other Ambulatory Visit: Payer: Self-pay

## 2017-12-03 NOTE — Patient Outreach (Signed)
[facility]) Care Management  12/03/2017  Victoria Graves Nov 01, 1955 [id number]   Medication Adherence call to Mrs. Victoria Graves spoke with patient she ask and prefers to get it thru [facility] but, they have it on back order ,patient will get it thru [facility] but on two separate medications instead of getting Losartan /HCTZ 100/25 mg she will be getting Losartan by itself and HTCZ by it self. [facility] will  verify with  doctor, patient is showing past due on Losartan / HCTZ 100/25 mg under [place].  [employer] Direct Dial [phone]  Fax [phone] [email or web address]

## 2018-04-06 DIAGNOSIS — Z1329 Encounter for screening for other suspected endocrine disorder: Secondary | ICD-10-CM | POA: Diagnosis not present

## 2018-04-06 DIAGNOSIS — I10 Essential (primary) hypertension: Secondary | ICD-10-CM | POA: Diagnosis not present

## 2018-04-06 DIAGNOSIS — E559 Vitamin D deficiency, unspecified: Secondary | ICD-10-CM | POA: Diagnosis not present

## 2018-04-06 DIAGNOSIS — E782 Mixed hyperlipidemia: Secondary | ICD-10-CM | POA: Diagnosis not present

## 2018-04-10 DIAGNOSIS — I10 Essential (primary) hypertension: Secondary | ICD-10-CM | POA: Diagnosis not present

## 2018-04-10 DIAGNOSIS — E559 Vitamin D deficiency, unspecified: Secondary | ICD-10-CM | POA: Diagnosis not present

## 2018-04-10 DIAGNOSIS — N182 Chronic kidney disease, stage 2 (mild): Secondary | ICD-10-CM | POA: Diagnosis not present

## 2018-04-10 DIAGNOSIS — E782 Mixed hyperlipidemia: Secondary | ICD-10-CM | POA: Diagnosis not present

## 2018-06-02 DIAGNOSIS — Z0000 Encounter for general adult medical examination without abnormal findings: Secondary | ICD-10-CM | POA: Diagnosis not present

## 2018-06-10 ENCOUNTER — Other Ambulatory Visit: Payer: Self-pay

## 2018-06-10 NOTE — Patient Outreach (Signed)
[facility]) Care Management  06/10/2018  Victoria Graves January 09, 1956 [id number]   Medication Adherence call to [name] patient did not answer patient is due on Pravastatin 40 mg and Losartan 50 mg under [place].   [facility] Management Direct Dial [phone]  Fax [phone] [email or web address]

## 2018-08-10 ENCOUNTER — Other Ambulatory Visit (HOSPITAL_COMMUNITY): Payer: Self-pay | Admitting: Internal Medicine

## 2018-08-10 DIAGNOSIS — Z1231 Encounter for screening mammogram for malignant neoplasm of breast: Secondary | ICD-10-CM

## 2018-08-28 ENCOUNTER — Other Ambulatory Visit: Payer: Self-pay

## 2018-08-28 ENCOUNTER — Ambulatory Visit (HOSPITAL_COMMUNITY)
Admission: RE | Admit: 2018-08-28 | Discharge: 2018-08-28 | Disposition: A | Discharge status: Home / Self Care | Payer: Medicare Other | Source: Ambulatory Visit | Attending: Internal Medicine | Admitting: Internal Medicine

## 2018-08-28 DIAGNOSIS — Z1231 Encounter for screening mammogram for malignant neoplasm of breast: Secondary | ICD-10-CM | POA: Diagnosis not present

## 2018-09-16 ENCOUNTER — Other Ambulatory Visit (HOSPITAL_COMMUNITY): Payer: Self-pay | Admitting: Internal Medicine

## 2018-09-16 DIAGNOSIS — R928 Other abnormal and inconclusive findings on diagnostic imaging of breast: Secondary | ICD-10-CM

## 2018-09-29 ENCOUNTER — Ambulatory Visit (HOSPITAL_COMMUNITY)
Admission: RE | Admit: 2018-09-29 | Discharge: 2018-09-29 | Disposition: A | Discharge status: Home / Self Care | Payer: Medicare Other | Source: Ambulatory Visit | Attending: Internal Medicine | Admitting: Internal Medicine

## 2018-09-29 ENCOUNTER — Other Ambulatory Visit (HOSPITAL_COMMUNITY): Payer: Self-pay | Admitting: Internal Medicine

## 2018-09-29 ENCOUNTER — Other Ambulatory Visit: Payer: Self-pay

## 2018-09-29 DIAGNOSIS — R928 Other abnormal and inconclusive findings on diagnostic imaging of breast: Secondary | ICD-10-CM

## 2018-09-29 DIAGNOSIS — N6321 Unspecified lump in the left breast, upper outer quadrant: Secondary | ICD-10-CM | POA: Diagnosis not present

## 2018-10-07 DIAGNOSIS — E559 Vitamin D deficiency, unspecified: Secondary | ICD-10-CM | POA: Diagnosis not present

## 2018-10-07 DIAGNOSIS — N182 Chronic kidney disease, stage 2 (mild): Secondary | ICD-10-CM | POA: Diagnosis not present

## 2018-10-07 DIAGNOSIS — E782 Mixed hyperlipidemia: Secondary | ICD-10-CM | POA: Diagnosis not present

## 2018-10-07 DIAGNOSIS — I10 Essential (primary) hypertension: Secondary | ICD-10-CM | POA: Diagnosis not present

## 2018-10-09 ENCOUNTER — Other Ambulatory Visit: Payer: Self-pay

## 2018-10-09 ENCOUNTER — Ambulatory Visit (HOSPITAL_COMMUNITY)
Admission: RE | Admit: 2018-10-09 | Discharge: 2018-10-09 | Disposition: A | Discharge status: Home / Self Care | Payer: Medicare Other | Source: Ambulatory Visit | Attending: Internal Medicine | Admitting: Internal Medicine

## 2018-10-09 DIAGNOSIS — R928 Other abnormal and inconclusive findings on diagnostic imaging of breast: Secondary | ICD-10-CM

## 2018-10-09 DIAGNOSIS — N6002 Solitary cyst of left breast: Secondary | ICD-10-CM | POA: Diagnosis not present

## 2018-10-13 ENCOUNTER — Other Ambulatory Visit (HOSPITAL_COMMUNITY): Payer: Medicare Other

## 2018-10-13 ENCOUNTER — Ambulatory Visit (HOSPITAL_COMMUNITY): Payer: Medicare Other

## 2018-10-13 DIAGNOSIS — E785 Hyperlipidemia, unspecified: Secondary | ICD-10-CM | POA: Diagnosis not present

## 2018-10-13 DIAGNOSIS — R7301 Impaired fasting glucose: Secondary | ICD-10-CM | POA: Diagnosis not present

## 2018-10-13 DIAGNOSIS — E782 Mixed hyperlipidemia: Secondary | ICD-10-CM | POA: Diagnosis not present

## 2018-10-13 DIAGNOSIS — N182 Chronic kidney disease, stage 2 (mild): Secondary | ICD-10-CM | POA: Diagnosis not present

## 2018-10-13 DIAGNOSIS — E559 Vitamin D deficiency, unspecified: Secondary | ICD-10-CM | POA: Diagnosis not present

## 2018-10-13 DIAGNOSIS — I10 Essential (primary) hypertension: Secondary | ICD-10-CM | POA: Diagnosis not present

## 2018-10-20 DIAGNOSIS — E782 Mixed hyperlipidemia: Secondary | ICD-10-CM | POA: Diagnosis not present

## 2018-10-20 DIAGNOSIS — R7301 Impaired fasting glucose: Secondary | ICD-10-CM | POA: Diagnosis not present

## 2018-10-20 DIAGNOSIS — N182 Chronic kidney disease, stage 2 (mild): Secondary | ICD-10-CM | POA: Diagnosis not present

## 2018-10-20 DIAGNOSIS — Z23 Encounter for immunization: Secondary | ICD-10-CM | POA: Diagnosis not present

## 2018-10-20 DIAGNOSIS — I1311 Hypertensive heart and chronic kidney disease without heart failure, with stage 5 chronic kidney disease, or end stage renal disease: Secondary | ICD-10-CM | POA: Diagnosis not present

## 2018-11-12 DIAGNOSIS — E559 Vitamin D deficiency, unspecified: Secondary | ICD-10-CM | POA: Diagnosis not present

## 2018-11-12 DIAGNOSIS — I10 Essential (primary) hypertension: Secondary | ICD-10-CM | POA: Diagnosis not present

## 2018-11-12 DIAGNOSIS — N182 Chronic kidney disease, stage 2 (mild): Secondary | ICD-10-CM | POA: Diagnosis not present

## 2018-11-12 DIAGNOSIS — E782 Mixed hyperlipidemia: Secondary | ICD-10-CM | POA: Diagnosis not present

## 2018-12-23 DIAGNOSIS — N182 Chronic kidney disease, stage 2 (mild): Secondary | ICD-10-CM | POA: Diagnosis not present

## 2018-12-23 DIAGNOSIS — E782 Mixed hyperlipidemia: Secondary | ICD-10-CM | POA: Diagnosis not present

## 2018-12-23 DIAGNOSIS — I10 Essential (primary) hypertension: Secondary | ICD-10-CM | POA: Diagnosis not present

## 2019-02-02 DIAGNOSIS — I129 Hypertensive chronic kidney disease with stage 1 through stage 4 chronic kidney disease, or unspecified chronic kidney disease: Secondary | ICD-10-CM | POA: Diagnosis not present

## 2019-02-02 DIAGNOSIS — E782 Mixed hyperlipidemia: Secondary | ICD-10-CM | POA: Diagnosis not present

## 2019-02-02 DIAGNOSIS — I10 Essential (primary) hypertension: Secondary | ICD-10-CM | POA: Diagnosis not present

## 2019-02-02 DIAGNOSIS — M170 Bilateral primary osteoarthritis of knee: Secondary | ICD-10-CM | POA: Diagnosis not present

## 2019-02-02 DIAGNOSIS — N182 Chronic kidney disease, stage 2 (mild): Secondary | ICD-10-CM | POA: Diagnosis not present

## 2019-04-05 DIAGNOSIS — I10 Essential (primary) hypertension: Secondary | ICD-10-CM | POA: Diagnosis not present

## 2019-04-05 DIAGNOSIS — E782 Mixed hyperlipidemia: Secondary | ICD-10-CM | POA: Diagnosis not present

## 2019-04-05 DIAGNOSIS — M170 Bilateral primary osteoarthritis of knee: Secondary | ICD-10-CM | POA: Diagnosis not present

## 2019-04-05 DIAGNOSIS — N182 Chronic kidney disease, stage 2 (mild): Secondary | ICD-10-CM | POA: Diagnosis not present

## 2019-04-05 DIAGNOSIS — I129 Hypertensive chronic kidney disease with stage 1 through stage 4 chronic kidney disease, or unspecified chronic kidney disease: Secondary | ICD-10-CM | POA: Diagnosis not present

## 2019-04-08 DIAGNOSIS — H25813 Combined forms of age-related cataract, bilateral: Secondary | ICD-10-CM | POA: Diagnosis not present

## 2019-04-08 DIAGNOSIS — H524 Presbyopia: Secondary | ICD-10-CM | POA: Diagnosis not present

## 2019-04-08 DIAGNOSIS — H04123 Dry eye syndrome of bilateral lacrimal glands: Secondary | ICD-10-CM | POA: Diagnosis not present

## 2019-04-19 DIAGNOSIS — R7301 Impaired fasting glucose: Secondary | ICD-10-CM | POA: Diagnosis not present

## 2019-04-19 DIAGNOSIS — E782 Mixed hyperlipidemia: Secondary | ICD-10-CM | POA: Diagnosis not present

## 2019-04-19 DIAGNOSIS — N182 Chronic kidney disease, stage 2 (mild): Secondary | ICD-10-CM | POA: Diagnosis not present

## 2019-04-19 DIAGNOSIS — E785 Hyperlipidemia, unspecified: Secondary | ICD-10-CM | POA: Diagnosis not present

## 2019-04-19 DIAGNOSIS — I10 Essential (primary) hypertension: Secondary | ICD-10-CM | POA: Diagnosis not present

## 2019-04-19 DIAGNOSIS — E559 Vitamin D deficiency, unspecified: Secondary | ICD-10-CM | POA: Diagnosis not present

## 2019-04-27 DIAGNOSIS — E782 Mixed hyperlipidemia: Secondary | ICD-10-CM | POA: Diagnosis not present

## 2019-04-27 DIAGNOSIS — M170 Bilateral primary osteoarthritis of knee: Secondary | ICD-10-CM | POA: Diagnosis not present

## 2019-04-27 DIAGNOSIS — E559 Vitamin D deficiency, unspecified: Secondary | ICD-10-CM | POA: Diagnosis not present

## 2019-04-27 DIAGNOSIS — Z0001 Encounter for general adult medical examination with abnormal findings: Secondary | ICD-10-CM | POA: Diagnosis not present

## 2019-04-27 DIAGNOSIS — I10 Essential (primary) hypertension: Secondary | ICD-10-CM | POA: Diagnosis not present

## 2019-04-28 DIAGNOSIS — H2512 Age-related nuclear cataract, left eye: Secondary | ICD-10-CM | POA: Diagnosis not present

## 2019-04-28 DIAGNOSIS — H25011 Cortical age-related cataract, right eye: Secondary | ICD-10-CM | POA: Diagnosis not present

## 2019-04-28 DIAGNOSIS — H2511 Age-related nuclear cataract, right eye: Secondary | ICD-10-CM | POA: Diagnosis not present

## 2019-05-05 DIAGNOSIS — H2512 Age-related nuclear cataract, left eye: Secondary | ICD-10-CM | POA: Diagnosis not present

## 2019-05-05 DIAGNOSIS — H25012 Cortical age-related cataract, left eye: Secondary | ICD-10-CM | POA: Diagnosis not present

## 2019-06-24 DIAGNOSIS — Z961 Presence of intraocular lens: Secondary | ICD-10-CM | POA: Diagnosis not present

## 2019-09-22 DIAGNOSIS — I10 Essential (primary) hypertension: Secondary | ICD-10-CM | POA: Diagnosis not present

## 2019-09-22 DIAGNOSIS — E7849 Other hyperlipidemia: Secondary | ICD-10-CM | POA: Diagnosis not present

## 2019-09-22 DIAGNOSIS — N182 Chronic kidney disease, stage 2 (mild): Secondary | ICD-10-CM | POA: Diagnosis not present

## 2019-09-22 DIAGNOSIS — I129 Hypertensive chronic kidney disease with stage 1 through stage 4 chronic kidney disease, or unspecified chronic kidney disease: Secondary | ICD-10-CM | POA: Diagnosis not present

## 2019-09-22 DIAGNOSIS — M170 Bilateral primary osteoarthritis of knee: Secondary | ICD-10-CM | POA: Diagnosis not present

## 2019-09-27 DIAGNOSIS — Z961 Presence of intraocular lens: Secondary | ICD-10-CM | POA: Diagnosis not present

## 2019-10-22 DIAGNOSIS — I10 Essential (primary) hypertension: Secondary | ICD-10-CM | POA: Diagnosis not present

## 2019-10-22 DIAGNOSIS — E782 Mixed hyperlipidemia: Secondary | ICD-10-CM | POA: Diagnosis not present

## 2019-10-22 DIAGNOSIS — R7303 Prediabetes: Secondary | ICD-10-CM | POA: Diagnosis not present

## 2019-10-22 DIAGNOSIS — E559 Vitamin D deficiency, unspecified: Secondary | ICD-10-CM | POA: Diagnosis not present

## 2019-10-22 DIAGNOSIS — E7849 Other hyperlipidemia: Secondary | ICD-10-CM | POA: Diagnosis not present

## 2019-10-27 DIAGNOSIS — E559 Vitamin D deficiency, unspecified: Secondary | ICD-10-CM | POA: Diagnosis not present

## 2019-10-27 DIAGNOSIS — Z23 Encounter for immunization: Secondary | ICD-10-CM | POA: Diagnosis not present

## 2019-10-27 DIAGNOSIS — M170 Bilateral primary osteoarthritis of knee: Secondary | ICD-10-CM | POA: Diagnosis not present

## 2019-10-27 DIAGNOSIS — I10 Essential (primary) hypertension: Secondary | ICD-10-CM | POA: Diagnosis not present

## 2019-10-27 DIAGNOSIS — E782 Mixed hyperlipidemia: Secondary | ICD-10-CM | POA: Diagnosis not present

## 2019-11-23 DIAGNOSIS — I10 Essential (primary) hypertension: Secondary | ICD-10-CM | POA: Diagnosis not present

## 2019-11-23 DIAGNOSIS — M179 Osteoarthritis of knee, unspecified: Secondary | ICD-10-CM | POA: Diagnosis not present

## 2019-11-23 DIAGNOSIS — E782 Mixed hyperlipidemia: Secondary | ICD-10-CM | POA: Diagnosis not present

## 2020-03-27 DIAGNOSIS — N182 Chronic kidney disease, stage 2 (mild): Secondary | ICD-10-CM | POA: Diagnosis not present

## 2020-03-27 DIAGNOSIS — I129 Hypertensive chronic kidney disease with stage 1 through stage 4 chronic kidney disease, or unspecified chronic kidney disease: Secondary | ICD-10-CM | POA: Diagnosis not present

## 2020-03-27 DIAGNOSIS — I10 Essential (primary) hypertension: Secondary | ICD-10-CM | POA: Diagnosis not present

## 2020-03-27 DIAGNOSIS — E782 Mixed hyperlipidemia: Secondary | ICD-10-CM | POA: Diagnosis not present

## 2020-03-27 DIAGNOSIS — M25532 Pain in left wrist: Secondary | ICD-10-CM | POA: Diagnosis not present

## 2020-03-27 DIAGNOSIS — E559 Vitamin D deficiency, unspecified: Secondary | ICD-10-CM | POA: Diagnosis not present

## 2020-03-27 DIAGNOSIS — R7301 Impaired fasting glucose: Secondary | ICD-10-CM | POA: Diagnosis not present

## 2020-03-27 DIAGNOSIS — L209 Atopic dermatitis, unspecified: Secondary | ICD-10-CM | POA: Diagnosis not present

## 2020-03-28 DIAGNOSIS — M79622 Pain in left upper arm: Secondary | ICD-10-CM | POA: Diagnosis not present

## 2020-03-28 DIAGNOSIS — T50Z95A Adverse effect of other vaccines and biological substances, initial encounter: Secondary | ICD-10-CM | POA: Diagnosis not present

## 2020-04-20 DIAGNOSIS — I10 Essential (primary) hypertension: Secondary | ICD-10-CM | POA: Diagnosis not present

## 2020-04-20 DIAGNOSIS — E559 Vitamin D deficiency, unspecified: Secondary | ICD-10-CM | POA: Diagnosis not present

## 2020-04-20 DIAGNOSIS — R7303 Prediabetes: Secondary | ICD-10-CM | POA: Diagnosis not present

## 2020-04-20 DIAGNOSIS — I129 Hypertensive chronic kidney disease with stage 1 through stage 4 chronic kidney disease, or unspecified chronic kidney disease: Secondary | ICD-10-CM | POA: Diagnosis not present

## 2020-04-20 DIAGNOSIS — E782 Mixed hyperlipidemia: Secondary | ICD-10-CM | POA: Diagnosis not present

## 2020-04-25 ENCOUNTER — Other Ambulatory Visit (HOSPITAL_COMMUNITY): Payer: Self-pay | Admitting: Internal Medicine

## 2020-04-25 DIAGNOSIS — H539 Unspecified visual disturbance: Secondary | ICD-10-CM | POA: Diagnosis not present

## 2020-04-25 DIAGNOSIS — R7303 Prediabetes: Secondary | ICD-10-CM | POA: Diagnosis not present

## 2020-04-25 DIAGNOSIS — E559 Vitamin D deficiency, unspecified: Secondary | ICD-10-CM | POA: Diagnosis not present

## 2020-04-25 DIAGNOSIS — Z1231 Encounter for screening mammogram for malignant neoplasm of breast: Secondary | ICD-10-CM

## 2020-04-25 DIAGNOSIS — M5432 Sciatica, left side: Secondary | ICD-10-CM | POA: Diagnosis not present

## 2020-04-25 DIAGNOSIS — R944 Abnormal results of kidney function studies: Secondary | ICD-10-CM | POA: Diagnosis not present

## 2020-04-25 DIAGNOSIS — I10 Essential (primary) hypertension: Secondary | ICD-10-CM | POA: Diagnosis not present

## 2020-04-25 DIAGNOSIS — M170 Bilateral primary osteoarthritis of knee: Secondary | ICD-10-CM | POA: Diagnosis not present

## 2020-04-25 DIAGNOSIS — E782 Mixed hyperlipidemia: Secondary | ICD-10-CM | POA: Diagnosis not present

## 2020-04-26 DIAGNOSIS — N182 Chronic kidney disease, stage 2 (mild): Secondary | ICD-10-CM | POA: Diagnosis not present

## 2020-04-26 DIAGNOSIS — M25532 Pain in left wrist: Secondary | ICD-10-CM | POA: Diagnosis not present

## 2020-04-26 DIAGNOSIS — R7301 Impaired fasting glucose: Secondary | ICD-10-CM | POA: Diagnosis not present

## 2020-04-26 DIAGNOSIS — L209 Atopic dermatitis, unspecified: Secondary | ICD-10-CM | POA: Diagnosis not present

## 2020-04-26 DIAGNOSIS — I10 Essential (primary) hypertension: Secondary | ICD-10-CM | POA: Diagnosis not present

## 2020-04-26 DIAGNOSIS — E782 Mixed hyperlipidemia: Secondary | ICD-10-CM | POA: Diagnosis not present

## 2020-06-12 ENCOUNTER — Ambulatory Visit (HOSPITAL_COMMUNITY)
Admission: RE | Admit: 2020-06-12 | Discharge: 2020-06-12 | Disposition: A | Discharge status: Home / Self Care | Payer: Medicare Other | Source: Ambulatory Visit | Attending: Internal Medicine | Admitting: Internal Medicine

## 2020-06-12 DIAGNOSIS — Z1231 Encounter for screening mammogram for malignant neoplasm of breast: Secondary | ICD-10-CM | POA: Insufficient documentation

## 2020-09-27 DIAGNOSIS — I129 Hypertensive chronic kidney disease with stage 1 through stage 4 chronic kidney disease, or unspecified chronic kidney disease: Secondary | ICD-10-CM | POA: Diagnosis not present

## 2020-09-27 DIAGNOSIS — I10 Essential (primary) hypertension: Secondary | ICD-10-CM | POA: Diagnosis not present

## 2020-09-27 IMAGING — US US BREAST CYST ASPIRATION 1ST CYST
1 series · 10 of 10 positions shown · non-contrast
Comparison: Previous exams.

CLINICAL DATA: Aspiration was recommended of a circumscribed 0.9 cm
cystic mass, for which ultrasound guided core needle aspiration was
recommended.

EXAM:
ULTRASOUND GUIDED LEFT BREAST CYST ASPIRATION

[Series 1: us breast cyst aspiration 1st cyst · 0.07mm/px · 10 of 10 slices shown]
[im 1/10]
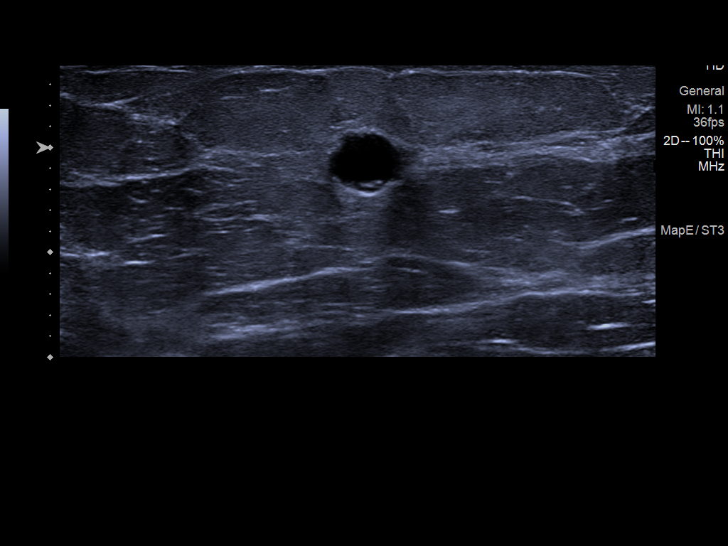
[im 2/10]
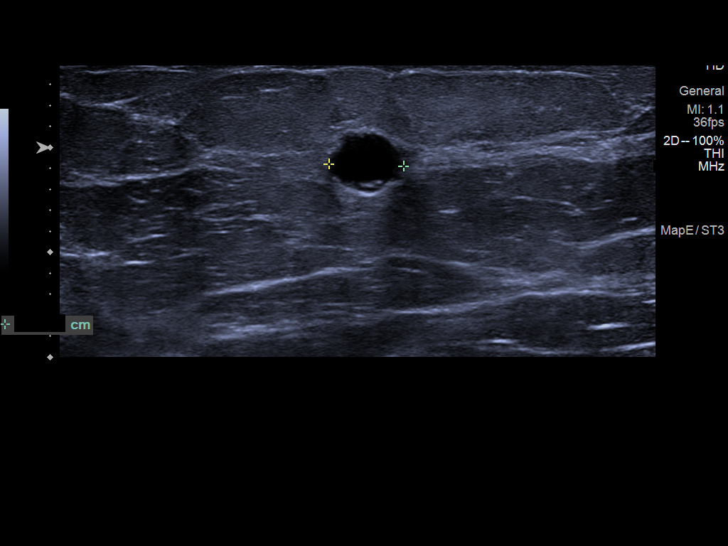
[im 3/10]
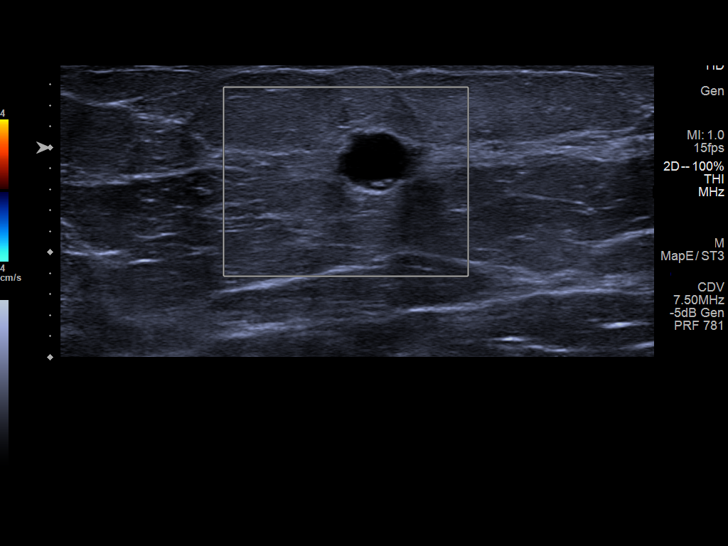
[im 4/10]
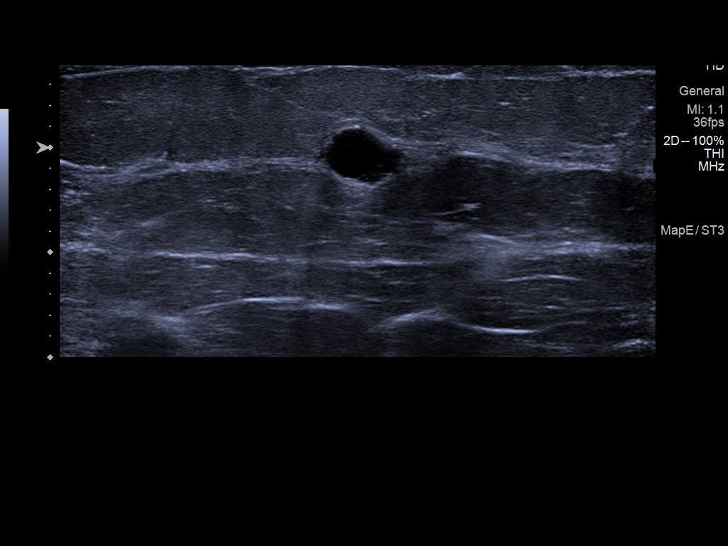
[im 5/10]
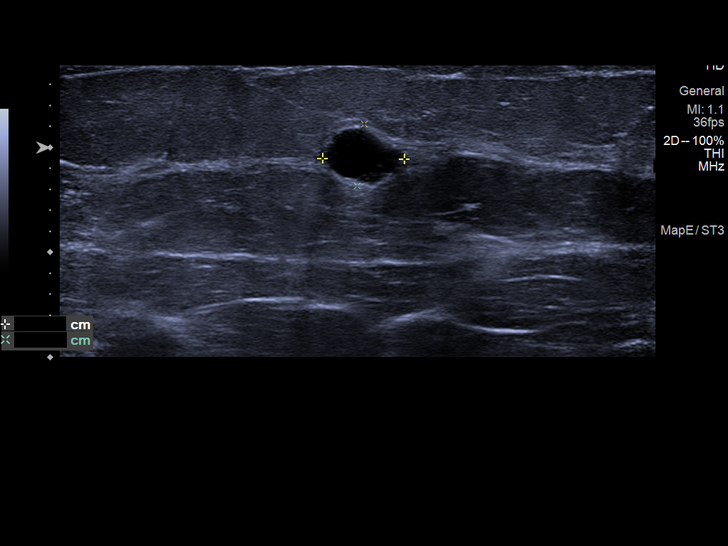
[im 6/10]
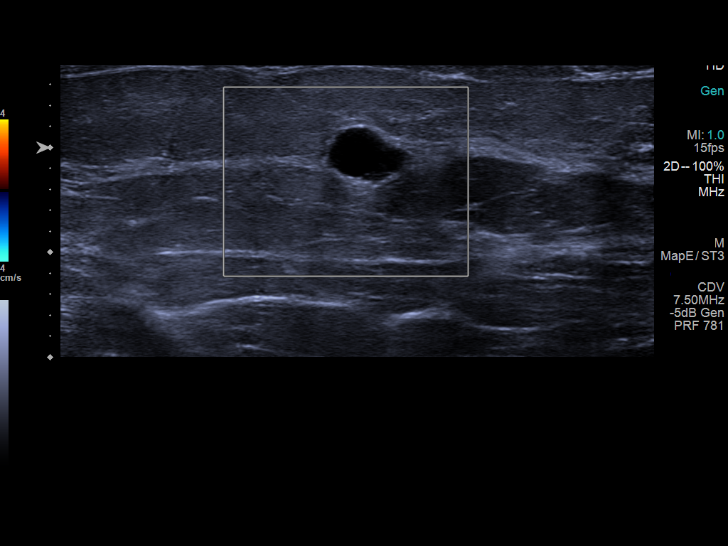
[im 7/10]
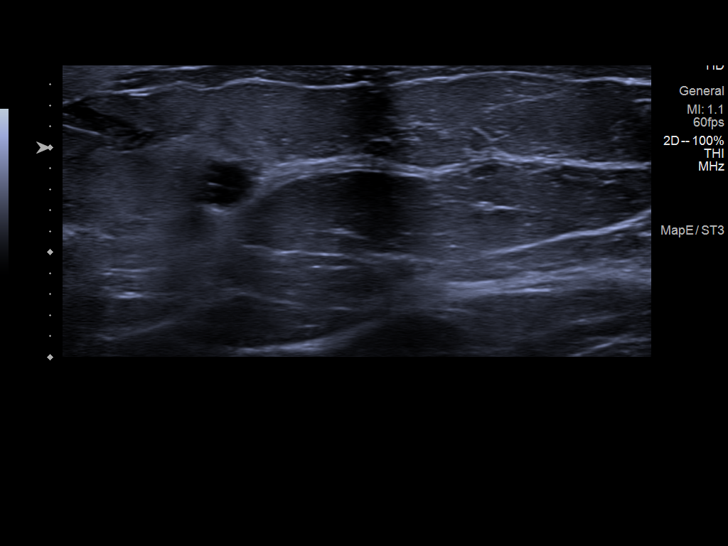
[im 8/10]
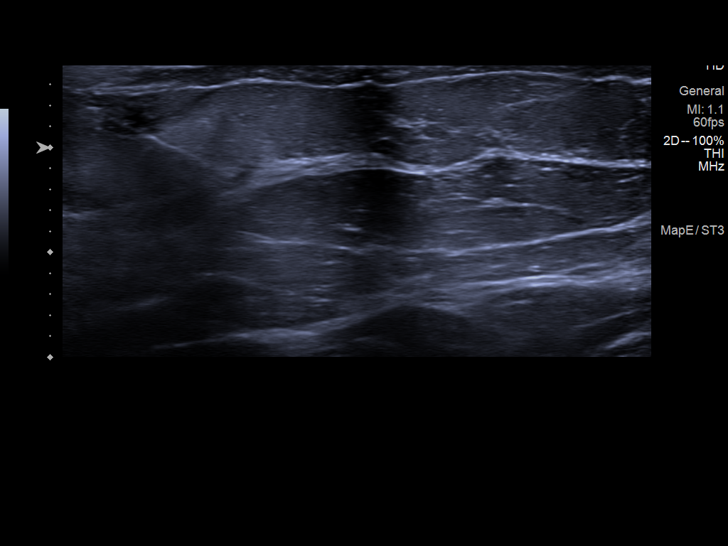
[im 9/10]
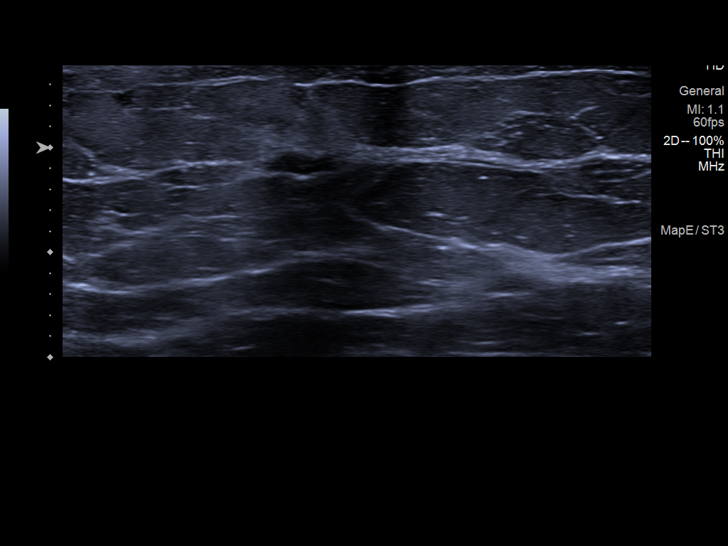
[im 10/10]
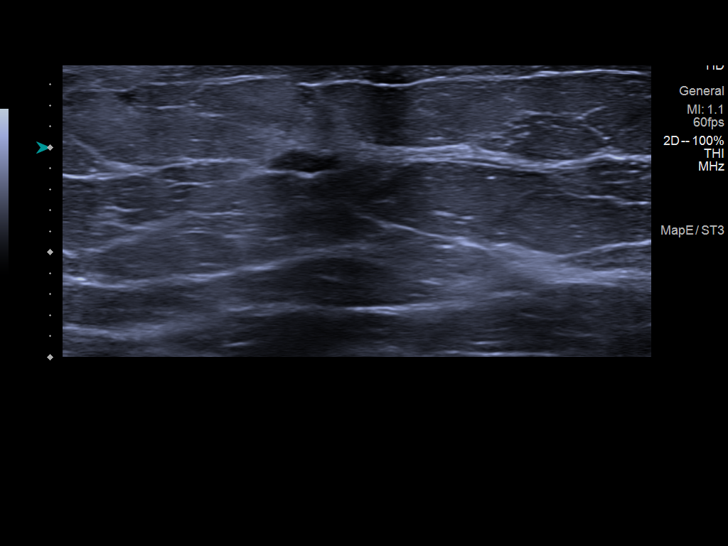

[10 of 10 positions shown; findings below may reference images not displayed]

PROCEDURE:
Using sterile technique, 1% lidocaine, under direct ultrasound
visualization, needle aspiration of a 0.8 cm cyst at 2 o'clock
position 4 cm from the nipple was performed. The cyst collapsed,
with only a thin amount of crescentic fluid remaining at the end of
the aspiration. There were no solid components. No suspicious
findings. The aspirate was discarded.
IMPRESSION: Ultrasound-guided aspiration of left breast cyst no apparent
complications.

RECOMMENDATIONS:
Bilateral screening mammogram is recommended in July 2019.

## 2020-10-24 DIAGNOSIS — E782 Mixed hyperlipidemia: Secondary | ICD-10-CM | POA: Diagnosis not present

## 2020-10-24 DIAGNOSIS — R7303 Prediabetes: Secondary | ICD-10-CM | POA: Diagnosis not present

## 2020-10-31 ENCOUNTER — Other Ambulatory Visit (HOSPITAL_COMMUNITY): Payer: Self-pay

## 2020-10-31 DIAGNOSIS — R944 Abnormal results of kidney function studies: Secondary | ICD-10-CM | POA: Diagnosis not present

## 2020-10-31 DIAGNOSIS — I10 Essential (primary) hypertension: Secondary | ICD-10-CM | POA: Diagnosis not present

## 2020-10-31 DIAGNOSIS — E559 Vitamin D deficiency, unspecified: Secondary | ICD-10-CM | POA: Diagnosis not present

## 2020-10-31 DIAGNOSIS — M5432 Sciatica, left side: Secondary | ICD-10-CM | POA: Diagnosis not present

## 2020-10-31 DIAGNOSIS — Z23 Encounter for immunization: Secondary | ICD-10-CM | POA: Diagnosis not present

## 2020-10-31 DIAGNOSIS — R7303 Prediabetes: Secondary | ICD-10-CM | POA: Diagnosis not present

## 2020-10-31 DIAGNOSIS — M170 Bilateral primary osteoarthritis of knee: Secondary | ICD-10-CM | POA: Diagnosis not present

## 2020-10-31 DIAGNOSIS — Z13820 Encounter for screening for osteoporosis: Secondary | ICD-10-CM

## 2020-10-31 DIAGNOSIS — E782 Mixed hyperlipidemia: Secondary | ICD-10-CM | POA: Diagnosis not present

## 2020-11-02 DIAGNOSIS — H04123 Dry eye syndrome of bilateral lacrimal glands: Secondary | ICD-10-CM | POA: Diagnosis not present

## 2020-11-06 ENCOUNTER — Other Ambulatory Visit: Payer: Self-pay

## 2020-11-06 ENCOUNTER — Ambulatory Visit (HOSPITAL_COMMUNITY)
Admission: RE | Admit: 2020-11-06 | Discharge: 2020-11-06 | Disposition: A | Discharge status: Home / Self Care | Payer: Medicare Other | Source: Ambulatory Visit | Attending: Internal Medicine | Admitting: Internal Medicine

## 2020-11-06 DIAGNOSIS — F17200 Nicotine dependence, unspecified, uncomplicated: Secondary | ICD-10-CM | POA: Insufficient documentation

## 2020-11-06 DIAGNOSIS — Z780 Asymptomatic menopausal state: Secondary | ICD-10-CM | POA: Diagnosis not present

## 2020-11-06 DIAGNOSIS — Z13820 Encounter for screening for osteoporosis: Secondary | ICD-10-CM | POA: Diagnosis not present

## 2020-11-06 DIAGNOSIS — M85852 Other specified disorders of bone density and structure, left thigh: Secondary | ICD-10-CM | POA: Diagnosis not present

## 2020-11-27 DIAGNOSIS — I10 Essential (primary) hypertension: Secondary | ICD-10-CM | POA: Diagnosis not present

## 2020-11-27 DIAGNOSIS — I129 Hypertensive chronic kidney disease with stage 1 through stage 4 chronic kidney disease, or unspecified chronic kidney disease: Secondary | ICD-10-CM | POA: Diagnosis not present

## 2020-12-27 DIAGNOSIS — I10 Essential (primary) hypertension: Secondary | ICD-10-CM | POA: Diagnosis not present

## 2020-12-27 DIAGNOSIS — E782 Mixed hyperlipidemia: Secondary | ICD-10-CM | POA: Diagnosis not present

## 2021-03-09 ENCOUNTER — Other Ambulatory Visit: Payer: Self-pay

## 2021-03-09 ENCOUNTER — Ambulatory Visit (INDEPENDENT_AMBULATORY_CARE_PROVIDER_SITE_OTHER): Payer: Medicare Other | Admitting: Nurse Practitioner

## 2021-03-09 ENCOUNTER — Encounter: Payer: Self-pay | Admitting: Nurse Practitioner

## 2021-03-09 VITALS — BP 130/70 | HR 71 | Ht <= 58 in | Wt 192.0 lb

## 2021-03-09 DIAGNOSIS — I10 Essential (primary) hypertension: Secondary | ICD-10-CM

## 2021-03-09 DIAGNOSIS — Z720 Tobacco use: Secondary | ICD-10-CM | POA: Diagnosis not present

## 2021-03-09 DIAGNOSIS — M85852 Other specified disorders of bone density and structure, left thigh: Secondary | ICD-10-CM

## 2021-03-09 DIAGNOSIS — E559 Vitamin D deficiency, unspecified: Secondary | ICD-10-CM

## 2021-03-09 DIAGNOSIS — E7849 Other hyperlipidemia: Secondary | ICD-10-CM

## 2021-03-09 DIAGNOSIS — F3289 Other specified depressive episodes: Secondary | ICD-10-CM | POA: Diagnosis not present

## 2021-03-09 DIAGNOSIS — E669 Obesity, unspecified: Secondary | ICD-10-CM | POA: Insufficient documentation

## 2021-03-09 NOTE — Assessment & Plan Note (Signed)
Takes pravastatin 40mg  daily

## 2021-03-09 NOTE — Patient Instructions (Signed)
Please get your labs done 3-5 days before your next visit. Please get your shingles vaccine at your pharmacy .     It is important that you exercise regularly at least 30 minutes 5 times a week.  Think about what you will eat, plan ahead. Choose " clean, green, fresh or frozen" over canned, processed or packaged foods which are more sugary, salty and fatty. 70 to 75% of food eaten should be vegetables and fruit. Three meals at set times with snacks allowed between meals, but they must be fruit or vegetables. Aim to eat over a 12 hour period , example 7 am to 7 pm, and STOP after  your last meal of the day. Drink water,generally about 64 ounces per day, no other drink is as healthy. Fruit juice is best enjoyed in a healthy way, by EATING the fruit.  Thanks for choosing [facility], we consider it a privelige to serve you.

## 2021-03-09 NOTE — Assessment & Plan Note (Signed)
Importance of healthy food choices with portion control discussed as well as eating regularly within 12  hour window.  ° °The need to choose clean green food 50%-75% of time is discussed as well as make water the primary drink and set a goal for 64 ounces daily. ° °Patient reeducated about the importance of committment to minimum of 150 minutes of exercise per week. ° °Three meals at set times with snacks allowed between meals but they must be fruit or vegetable.  ° °Aim to eat  over 12 hour period  for example 7 am to 7 pm. Stop after your last meal of the day.  °

## 2021-03-09 NOTE — Assessment & Plan Note (Addendum)
Well controlled continue current meds. Takes bupropion 150 mg daily Prozac 20 mg daily.

## 2021-03-09 NOTE — Assessment & Plan Note (Signed)
Patient stated that she takes calcium with vitamin D supplement.  DEXA scan done in October 2022.

## 2021-03-09 NOTE — Assessment & Plan Note (Signed)
Takes 2000 units daily

## 2021-03-09 NOTE — Progress Notes (Signed)
New Patient Office Visit  Subjective:  Patient ID: Victoria Graves, female    DOB: 01-25-1956  Age: 66 y.o. MRN: [id number]  CC:  Chief Complaint  Patient presents with   New Patient (Initial Visit)    NP    HPI FARRIE SANN presents to establish care. Previous PCP- Wende Neighbors , MD  Depression. Wellbutrin XL 50m daily, prozac 247mdaily  Vitamin D deff. Vitamin D 2000 units daily HTN. Clonidine 0.77m80mt bedtime, lorsartan-hydrochlorothiazide 100-3m35maily  HLD.  Pravastatin 40mg80mteopenia.  DEXA scan done in 2022.  Takes vitamin D and calcium supplement.  Patient is due for her shingles vaccine and COVID booster.  Need for both vaccines discussed with patient, she verbalized understanding. Patient is a current smoker, smokes 1 pack/week, smoking cessation education completed, patient verbalized understanding.  Patient stated that she will continue to work on smoking cessation. She has had a total hysterectomy.  Up-to-date with mammogram and colonoscopy.   Past Medical History:  Diagnosis Date   Allergy    Anxiety    Arthritis    Depression    GERD (gastroesophageal reflux disease)    Hyperlipidemia    Hypertension     Past Surgical History:  Procedure Laterality Date   ABDOMINAL HYSTERECTOMY  1999   fibroid   COLONOSCOPY N/A 08/21/2016   Procedure: COLONOSCOPY;  Surgeon: FieldDanie Binder  Location: [facility];  Service: Endoscopy;  Laterality: N/A;  9:30 AM   POLYPECTOMY  08/21/2016   Procedure: POLYPECTOMY;  Surgeon: FieldDanie Binder  Location: [facility];  Service: Endoscopy;;  ascending colon and sigmoid x2    Family History  Problem Relation Age of Onset   Hypertension Mother    Arthritis Mother    Heart disease Maternal Grandmother    Kidney disease Maternal Grandmother     Social History   Socioeconomic History   Marital status: Divorced    Spouse name: Not on file   Number of children: 0   Years of education: 10   Highest  education level: Not on file  Occupational History   Occupation: disability    Comment: back and hip  Tobacco Use   Smoking status: Every Day    Packs/day: 0.10    Types: Cigarettes   Smokeless tobacco: Never  Substance and Sexual Activity   Alcohol use: Yes    Comment: occasional   Drug use: No   Sexual activity: Yes    Birth control/protection: Surgical  Other Topics Concern   Not on file  Social History Narrative   Lives with friend and cousin who has mental disability   Social Determinants of Health   Financial Resource Strain: Not on file  Food Insecurity: Not on file  Transportation Needs: Not on file  Physical Activity: Not on file  Stress: Not on file  Social Connections: Not on file  Intimate Partner Violence: Not on file    ROS Review of Systems  Constitutional: Negative.   Respiratory: Negative.    Cardiovascular: Negative.   Gastrointestinal: Negative.   Psychiatric/Behavioral: Negative.     Objective:   Today's Vitals: BP 130/70 (BP Location: Right Arm, Patient Position: Sitting, Cuff Size: Large)    Pulse 71    Ht 4' 10"  (1.473 m)    Wt 192 lb (87.1 kg)    SpO2 100%    BMI 40.13 kg/m   Physical Exam Constitutional:      General: She is not  in acute distress.    Appearance: Normal appearance. She is obese. She is not ill-appearing or diaphoretic.  Cardiovascular:     Rate and Rhythm: Normal rate and regular rhythm.     Pulses: Normal pulses.     Heart sounds: Normal heart sounds. No murmur heard.   No friction rub.  Pulmonary:     Effort: Pulmonary effort is normal. No respiratory distress.     Breath sounds: Normal breath sounds. No stridor. No wheezing, rhonchi or rales.  Chest:     Chest wall: No tenderness.  Neurological:     Mental Status: She is alert and oriented to person, place, and time.  Psychiatric:        Mood and Affect: Mood normal.        Behavior: Behavior normal.        Thought Content: Thought content normal.         Judgment: Judgment normal.    Assessment & Plan:   Problem List Items Addressed This Visit   None   Outpatient Encounter Medications as of 03/09/2021  Medication Sig   aspirin EC 81 MG tablet Take 81 mg by mouth daily.   buPROPion (WELLBUTRIN XL) 150 MG 24 hr tablet Take 150 mg by mouth daily.   Cholecalciferol (VITAMIN D) 2000 units CAPS Take 2,000 Units by mouth daily.   cloNIDine (CATAPRES) 0.1 MG tablet Take 0.1 mg by mouth at bedtime.   cycloSPORINE (RESTASIS MULTIDOSE) 0.05 % ophthalmic emulsion Place 2 drops into both eyes 2 (two) times daily.   FLUoxetine (PROZAC) 20 MG capsule Take 1 capsule (20 mg total) by mouth daily.   fluticasone (FLONASE) 50 MCG/ACT nasal spray Place 1 spray into both nostrils daily as needed for allergies or rhinitis.   losartan-hydrochlorothiazide (HYZAAR) 100-25 MG tablet Take by mouth.   naproxen (NAPROSYN) 500 MG tablet Take 1 tablet (500 mg total) by mouth 2 (two) times daily as needed.   pravastatin (PRAVACHOL) 40 MG tablet Take 1 tablet (40 mg total) by mouth daily.   BLACK COHOSH EXTRACT PO Take 540 mg by mouth daily.  (Patient not taking: Reported on 03/09/2021)   Na Sulfate-K Sulfate-Mg Sulf (SUPREP BOWEL PREP KIT) 17.5-3.13-1.6 GM/180ML SOLN Take 1 kit by mouth as directed. (Patient not taking: Reported on 03/09/2021)   terbinafine (LAMISIL) 250 MG tablet Take 1 tablet (250 mg total) by mouth daily. (Patient not taking: Reported on 03/09/2021)   Tetrahydrozoline HCl (VISINE OP) Apply 1 drop to eye daily as needed (dry eyes). (Patient not taking: Reported on 03/09/2021)   [DISCONTINUED] losartan-hydrochlorothiazide (HYZAAR) 50-12.5 MG tablet Take 1 tablet by mouth daily. (Patient not taking: Reported on 03/09/2021)   No facility-administered encounter medications on file as of 03/09/2021.    Follow-up: No follow-ups on file.   Renee Rival, FNP

## 2021-03-09 NOTE — Assessment & Plan Note (Addendum)
DASH diet and commitment to daily physical activity for a minimum of 30 minutes discussed and encouraged, as a part of hypertension management. The importance of attaining a healthy weight is also discussed.  BP/Weight 03/09/2021 02/06/2017 08/21/2016 08/01/2016 05/29/2016 01/31/2016 62/04/4693  Systolic BP 072 257 505 183 358 251 898  Diastolic BP 70 78 47 80 74 70 84  Wt. (Lbs) 192 187.08 - 188.04 190.04 184.04 182.08  BMI 40.13 40.48 - 40.69 41.12 38.46 38.05   Takes losartan hydrochlorothiazide 100-25 milligrams daily clonidine 0.1 mg at bedtime

## 2021-03-09 NOTE — Assessment & Plan Note (Signed)
Patient is a current smoker, smokes 1 pack/week, smoking cessation education completed, patient verbalized understanding.  Patient stated that she will continue to work on smoking cessation.

## 2021-05-01 DIAGNOSIS — E559 Vitamin D deficiency, unspecified: Secondary | ICD-10-CM | POA: Diagnosis not present

## 2021-05-01 DIAGNOSIS — E7849 Other hyperlipidemia: Secondary | ICD-10-CM | POA: Diagnosis not present

## 2021-05-01 DIAGNOSIS — I10 Essential (primary) hypertension: Secondary | ICD-10-CM | POA: Diagnosis not present

## 2021-05-02 LAB — CBC WITH DIFFERENTIAL/PLATELET
Basophils Absolute: 0.1 10*3/uL (ref 0.0–0.2)
Basos: 1 %
EOS (ABSOLUTE): 0.3 10*3/uL (ref 0.0–0.4)
Eos: 6 %
Hematocrit: 40.7 % (ref 34.0–46.6)
Hemoglobin: 13.3 g/dL (ref 11.1–15.9)
Immature Grans (Abs): 0 10*3/uL (ref 0.0–0.1)
Immature Granulocytes: 0 %
Lymphocytes Absolute: 1.8 10*3/uL (ref 0.7–3.1)
Lymphs: 38 %
MCH: 27.9 pg (ref 26.6–33.0)
MCHC: 32.7 g/dL (ref 31.5–35.7)
MCV: 86 fL (ref 79–97)
Monocytes Absolute: 0.4 10*3/uL (ref 0.1–0.9)
Monocytes: 9 %
Neutrophils Absolute: 2.1 10*3/uL (ref 1.4–7.0)
Neutrophils: 46 %
Platelets: 258 10*3/uL (ref 150–450)
RBC: 4.76 x10E6/uL (ref 3.77–5.28)
RDW: 12.8 % (ref 11.7–15.4)
WBC: 4.7 10*3/uL (ref 3.4–10.8)

## 2021-05-02 LAB — LIPID PANEL
Chol/HDL Ratio: 2.4 ratio (ref 0.0–4.4)
Cholesterol, Total: 174 mg/dL (ref 100–199)
HDL: 73 mg/dL (ref >39)
LDL Chol Calc (NIH): 85 mg/dL (ref 0–99)
Triglycerides: 88 mg/dL (ref 0–149)
VLDL Cholesterol Cal: 16 mg/dL (ref 5–40)

## 2021-05-02 LAB — CMP14+EGFR
ALT: 10 IU/L (ref 0–32)
AST: 11 IU/L (ref 0–40)
Albumin/Globulin Ratio: 2.2 (ref 1.2–2.2)
Albumin: 4.1 g/dL (ref 3.8–4.8)
Alkaline Phosphatase: 63 IU/L (ref 44–121)
BUN/Creatinine Ratio: 10 — ABNORMAL LOW (ref 12–28)
BUN: 11 mg/dL (ref 8–27)
Bilirubin Total: 0.4 mg/dL (ref 0.0–1.2)
CO2: 25 mmol/L (ref 20–29)
Calcium: 9.7 mg/dL (ref 8.7–10.3)
Chloride: 105 mmol/L (ref 96–106)
Creatinine, Ser: 1.1 mg/dL — ABNORMAL HIGH (ref 0.57–1.00)
Globulin, Total: 1.9 g/dL (ref 1.5–4.5)
Glucose: 107 mg/dL — ABNORMAL HIGH (ref 70–99)
Potassium: 4.3 mmol/L (ref 3.5–5.2)
Sodium: 145 mmol/L — ABNORMAL HIGH (ref 134–144)
Total Protein: 6 g/dL (ref 6.0–8.5)
eGFR: 56 mL/min/{1.73_m2} — ABNORMAL LOW (ref >59)

## 2021-05-02 LAB — VITAMIN D 25 HYDROXY (VIT D DEFICIENCY, FRACTURES): Vit D, 25-Hydroxy: 39 ng/mL (ref 30.0–100.0)

## 2021-05-03 ENCOUNTER — Other Ambulatory Visit (HOSPITAL_COMMUNITY): Payer: Self-pay | Admitting: Nurse Practitioner

## 2021-05-03 DIAGNOSIS — Z1231 Encounter for screening mammogram for malignant neoplasm of breast: Secondary | ICD-10-CM

## 2021-05-07 ENCOUNTER — Ambulatory Visit: Payer: Medicare Other | Admitting: Nurse Practitioner

## 2021-05-08 ENCOUNTER — Ambulatory Visit (INDEPENDENT_AMBULATORY_CARE_PROVIDER_SITE_OTHER): Payer: Medicare Other | Admitting: Nurse Practitioner

## 2021-05-08 ENCOUNTER — Encounter: Payer: Self-pay | Admitting: Nurse Practitioner

## 2021-05-08 VITALS — BP 142/68 | HR 70 | Ht <= 58 in | Wt 191.0 lb

## 2021-05-08 DIAGNOSIS — M1612 Unilateral primary osteoarthritis, left hip: Secondary | ICD-10-CM | POA: Diagnosis not present

## 2021-05-08 DIAGNOSIS — N183 Chronic kidney disease, stage 3 unspecified: Secondary | ICD-10-CM

## 2021-05-08 DIAGNOSIS — N1831 Chronic kidney disease, stage 3a: Secondary | ICD-10-CM | POA: Diagnosis not present

## 2021-05-08 DIAGNOSIS — E7849 Other hyperlipidemia: Secondary | ICD-10-CM

## 2021-05-08 DIAGNOSIS — I10 Essential (primary) hypertension: Secondary | ICD-10-CM

## 2021-05-08 HISTORY — DX: Chronic kidney disease, stage 3 unspecified: N18.30

## 2021-05-08 MED ORDER — LOSARTAN POTASSIUM-HCTZ 100-25 MG PO TABS: 1.0000 | ORAL_TABLET | Freq: Every day | ORAL | 0 refills | Status: DC

## 2021-05-08 NOTE — Assessment & Plan Note (Signed)
Lab Results  ?Component Value Date  ? CHOL 174 05/01/2021  ? HDL 73 05/01/2021  ? [place] 85 05/01/2021  ? TRIG 88 05/01/2021  ? CHOLHDL 2.4 05/01/2021  ?Taking pravastatin 40 mg daily, LDL goal is less than 100. ?Continue current medication ?Avoid fatty  fried foods. ?The 10-year ASCVD risk score (Arnett DK, et al., 2019) is: 17.2% ?  Values used to calculate the score: ?    Age: 66 years ?    Sex: Female ?    Is Non-Hispanic African American: Yes ?    Diabetic: No ?    Tobacco smoker: Yes ?    Systolic Blood Pressure: 130 mmHg ?    Is BP treated: Yes ?    HDL Cholesterol: 73 mg/dL ?    Total Cholesterol: 174 mg/dL ? ?

## 2021-05-08 NOTE — Assessment & Plan Note (Signed)
Chronic condition ?Taking naproxen 500 mg twice daily as needed ?Has chronic kidney disease, patient told to stop taking naproxen ?Take Tylenol 650 mg every 6 hours as needed for pain ?Patient would like referral to orthopedics. ?

## 2021-05-08 NOTE — Assessment & Plan Note (Addendum)
BP Readings from Last 3 Encounters:  ?05/08/21 140/72  ?03/09/21 130/70  ?02/06/17 122/78  ?systolic Blood pressure not at goal of less than 140 ?has been taking half tablet of losartan -HTCZ 100-'25mg'$  tablet daily, Catapres 0.1 mg daily ?Start taking whole tablet losartan-hydrochlorothiazide 100-25 mg tablet daily, continue Catapres 0.1 mg daily ?Recheck BP in 4 weeks ?DASH diet advised engage in regular vigorous exercise at least 150 minutes weekly ?BMP in 2 weeks ? ?

## 2021-05-08 NOTE — Patient Instructions (Addendum)
Please take  TYLENOL '650MG'$  every 6 hours as needed for your pain.  ?Pleas start taking a whole pill of your losartan-hydrochlorothiazide 100-'25mg'$  tablet daily.  ?Please get your shingles vaccine at your pharmacy. ? ?It is important that you exercise regularly at least 30 minutes 5 times a week.  ?Think about what you will eat, plan ahead. ?Choose " clean, green, fresh or frozen" over canned, processed or packaged foods which are more sugary, salty and fatty. ?70 to 75% of food eaten should be vegetables and fruit. ?Three meals at set times with snacks allowed between meals, but they must be fruit or vegetables. ?Aim to eat over a 12 hour period , example 7 am to 7 pm, and STOP after  your last meal of the day. ?Drink water,generally about 64 ounces per day, no other drink is as healthy. Fruit juice is best enjoyed in a healthy way, by EATING the fruit. ? ?Thanks for choosing [facility], we consider it a privelige to serve you. ? ? ?

## 2021-05-08 NOTE — Progress Notes (Signed)
? ?Victoria Graves     MRN: [id number]      DOB: [birth year] ? ? ?HPI ?Ms. Gapinski with past medical history of hypertension, osteoarthritis of left hip, hyperlipidemia, morbid obesity is here for follow up for hypertension.  ? ?Pt c/o chronic left hip pain , states that she has been taking naproxen prn, pain goes goes away after sitting. Denies numbness , tingling of her hip. Pain is 10/10 when its bad. ? ? ? ?ROS ?Denies recent fever or chills. ?Denies sinus pressure, nasal congestion, ear pain or sore throat. ?Denies chest congestion, productive cough or wheezing. ?Denies chest pains, palpitations and leg swelling ?Denies abdominal pain, nausea, vomiting,diarrhea or constipation.   ?Denies limitation in mobility. ?Denies headaches, seizures, numbness, or tingling. ?Denies depression, anxiety or insomnia. ? ? ? ?PE ? ?BP (!) 142/68 (BP Location: Right Arm, Cuff Size: Normal)   Pulse 70   Ht 4\' 10"  (1.473 m)   Wt 191 lb (86.6 kg)   SpO2 99%   BMI 39.92 kg/m?  ? ?Patient alert and oriented and in no cardiopulmonary distress. ? ?Chest: Clear to auscultation bilaterally. ? ?CVS: S1, S2 no murmurs, no S3.Regular rate. ? ?ABD: Soft non tender.  ? ?Ext: No edema ? ?MS: Adequate ROM spine, shoulders, hips and knees.  Tenderness on palpation of left hip ? ?Psych: Good eye contact, normal affect. Memory intact not anxious or depressed appearing. ? ?CNS: CN 2-12 intact, power,  normal throughout.no focal deficits noted. ? ? ?Assessment & Plan ?HTN (hypertension) ?BP Readings from Last 3 Encounters:  ?05/08/21 140/72  ?03/09/21 130/70  ?02/06/17 122/78  ?systolic Blood pressure not at goal of less than 140 ?has been taking half tablet of losartan -HTCZ 100-25mg  tablet daily, Catapres 0.1 mg daily ?Start taking whole tablet losartan-hydrochlorothiazide 100-25 mg tablet daily, continue Catapres 0.1 mg daily ?Recheck BP in 4 weeks ?DASH diet advised engage in regular vigorous exercise at least 150 minutes  weekly ? ? ?Osteoarthritis of left hip ?Chronic condition ?Taking naproxen 500 mg twice daily as needed ?Has chronic kidney disease, patient told to stop taking naproxen ?Take Tylenol 650 mg every 6 hours as needed for pain ?Patient would like referral to orthopedics. ? ?HLD (hyperlipidemia) ?Lab Results  ?Component Value Date  ? CHOL 174 05/01/2021  ? HDL 73 05/01/2021  ? [place] 85 05/01/2021  ? TRIG 88 05/01/2021  ? CHOLHDL 2.4 05/01/2021  ?Taking pravastatin 40 mg daily, LDL goal is less than 100. ?Continue current medication ?Avoid fatty  fried foods. ?The 10-year ASCVD risk score (Arnett DK, et al., 2019) is: 17.2% ?  Values used to calculate the score: ?    Age: 66 years ?    Sex: Female ?    Is Non-Hispanic African American: Yes ?    Diabetic: No ?    Tobacco smoker: Yes ?    Systolic Blood Pressure: 625 mmHg ?    Is BP treated: Yes ?    HDL Cholesterol: 73 mg/dL ?    Total Cholesterol: 174 mg/dL ? ? ?CKD (chronic kidney disease) stage 3, GFR 30-59 ml/min (HCC) ?Lab Results  ?Component Value Date  ? NA 145 (H) 05/01/2021  ? K 4.3 05/01/2021  ? CO2 25 05/01/2021  ? GLUCOSE 107 (H) 05/01/2021  ? BUN 11 05/01/2021  ? CREATININE 1.10 (H) 05/01/2021  ? CALCIUM 9.7 05/01/2021  ? EGFR 56 (L) 05/01/2021  ? GFRNONAA 60 02/06/2017  ?. ? stop  Naproxen, avoid nephrotoxin agents ?Drink at least  64 ounces of water daily ?On losartan with hydrochlorothiazide 10-25 mg daily ?Will monitor labs  ? ?

## 2021-05-08 NOTE — Assessment & Plan Note (Signed)
Lab Results  ?Component Value Date  ? NA 145 (H) 05/01/2021  ? K 4.3 05/01/2021  ? CO2 25 05/01/2021  ? GLUCOSE 107 (H) 05/01/2021  ? BUN 11 05/01/2021  ? CREATININE 1.10 (H) 05/01/2021  ? CALCIUM 9.7 05/01/2021  ? EGFR 56 (L) 05/01/2021  ? GFRNONAA 60 02/06/2017  ?. ? stop  Naproxen, avoid nephrotoxin agents ?Drink at least 64 ounces of water daily ?On losartan with hydrochlorothiazide 10-25 mg daily ?Will monitor labs ?

## 2021-05-22 ENCOUNTER — Other Ambulatory Visit: Payer: Self-pay | Admitting: Nurse Practitioner

## 2021-05-22 MED ORDER — BUPROPION HCL ER (XL) 150 MG PO TB24: 150.0000 mg | ORAL_TABLET | Freq: Every day | ORAL | 0 refills | Status: DC

## 2021-05-23 ENCOUNTER — Other Ambulatory Visit: Payer: Self-pay

## 2021-05-23 MED ORDER — BUPROPION HCL ER (XL) 150 MG PO TB24: 150.0000 mg | ORAL_TABLET | Freq: Every day | ORAL | 0 refills | Status: DC

## 2021-06-01 ENCOUNTER — Other Ambulatory Visit: Payer: Self-pay

## 2021-06-06 ENCOUNTER — Other Ambulatory Visit: Payer: Self-pay | Admitting: Nurse Practitioner

## 2021-06-06 MED ORDER — FLUOXETINE HCL 20 MG PO CAPS: 20.0000 mg | ORAL_CAPSULE | Freq: Every day | ORAL | 3 refills | Status: DC

## 2021-06-14 ENCOUNTER — Ambulatory Visit (HOSPITAL_COMMUNITY): Payer: Medicare Other

## 2021-06-14 ENCOUNTER — Other Ambulatory Visit: Payer: Self-pay

## 2021-06-14 ENCOUNTER — Other Ambulatory Visit: Payer: Self-pay | Admitting: Nurse Practitioner

## 2021-06-14 MED ORDER — PRAVASTATIN SODIUM 40 MG PO TABS: 40.0000 mg | ORAL_TABLET | Freq: Every day | ORAL | 3 refills | Status: DC

## 2021-06-15 ENCOUNTER — Encounter: Payer: Self-pay | Admitting: Nurse Practitioner

## 2021-06-15 ENCOUNTER — Ambulatory Visit (INDEPENDENT_AMBULATORY_CARE_PROVIDER_SITE_OTHER): Payer: Medicare Other | Admitting: Nurse Practitioner

## 2021-06-15 ENCOUNTER — Ambulatory Visit (HOSPITAL_COMMUNITY)
Admission: RE | Admit: 2021-06-15 | Discharge: 2021-06-15 | Disposition: A | Discharge status: Home / Self Care | Payer: Medicare Other | Source: Ambulatory Visit | Attending: Nurse Practitioner | Admitting: Nurse Practitioner

## 2021-06-15 ENCOUNTER — Ambulatory Visit: Payer: Medicare Other | Admitting: Orthopedic Surgery

## 2021-06-15 VITALS — BP 152/68 | HR 74 | Ht <= 58 in | Wt 195.0 lb

## 2021-06-15 DIAGNOSIS — Z1231 Encounter for screening mammogram for malignant neoplasm of breast: Secondary | ICD-10-CM | POA: Diagnosis not present

## 2021-06-15 DIAGNOSIS — Z720 Tobacco use: Secondary | ICD-10-CM | POA: Diagnosis not present

## 2021-06-15 DIAGNOSIS — I10 Essential (primary) hypertension: Secondary | ICD-10-CM | POA: Diagnosis not present

## 2021-06-15 MED ORDER — AMLODIPINE BESYLATE 2.5 MG PO TABS: 2.5000 mg | ORAL_TABLET | Freq: Every day | ORAL | 0 refills | Status: DC

## 2021-06-15 NOTE — Patient Instructions (Signed)
Start taking amlodipine 2.'5mg'$  daily continue, losartan hydrochlorothiazide 100-'25mg'$  tablet daily .   It is important that you exercise regularly at least 30 minutes 5 times a week.  Think about what you will eat, plan ahead. Choose " clean, green, fresh or frozen" over canned, processed or packaged foods which are more sugary, salty and fatty. 70 to 75% of food eaten should be vegetables and fruit. Three meals at set times with snacks allowed between meals, but they must be fruit or vegetables. Aim to eat over a 12 hour period , example 7 am to 7 pm, and STOP after  your last meal of the day. Drink water,generally about 64 ounces per day, no other drink is as healthy. Fruit juice is best enjoyed in a healthy way, by EATING the fruit.  Thanks for choosing [facility], we consider it a privelige to serve you.

## 2021-06-15 NOTE — Assessment & Plan Note (Signed)
BP Readings from Last 3 Encounters:  06/15/21 (!) 152/68  05/08/21 (!) 142/68  03/09/21 130/70  Chronic condition uncontrolled Currently on losartan-hydrochlorothiazide 100-25 mg, 1 tablet daily,  Taking clonidine 0.1 mg daily at bedtime for hot flashes Start amlodipine 2.5 mg daily DASH diet advised engage in moderate exercises at least for 50 minutes Monitor blood pressure at home Smoking cessation education completed Follow-up in 4 weeks

## 2021-06-15 NOTE — Progress Notes (Addendum)
   Victoria Graves     MRN: [id number]      DOB: 13-Apr-1955   HPI Victoria Graves with past medical history of hypertension, obesity, CKD stage III, hyperlipidemia, depression, tobacco abuse is here for follow up for hypertension.  Patient stated that she has been taking losartan-hydrochlorothiazide 100-25 mg tablet daily as ordered reports checking blood pressure at home stated that her systolic blood pressure has been in the 140s, patient denies chest pain, dizziness, edema, headaches.       ROS Denies recent fever or chills. Denies sinus pressure, nasal congestion, ear pain or sore throat. Denies chest congestion, productive cough or wheezing. Denies chest pains, palpitations and leg swelling Denies abdominal pain, nausea, vomiting,diarrhea or constipation.   Denies dysuria, frequency, hesitancy or incontinence. Denies depression, anxiety or insomnia.   PE  BP (!) 154/68 (BP Location: Left Arm, Cuff Size: Normal)   Pulse 74   Ht '4\' 10"'$  (1.473 m)   Wt 195 lb (88.5 kg)   SpO2 93%   BMI 40.76 kg/m   Patient alert and oriented and in no cardiopulmonary distress.Chest: Clear to auscultation bilaterally.  CVS: S1, S2 no murmurs, no S3.Regular rate.  ABD: Soft non tender.   Ext: No edema  MS: Adequate ROM spine, shoulders, hips and knees.  Psych: Good eye contact, normal affect. Memory intact not anxious or depressed appearing.    Assessment & Plan  HTN (hypertension) BP Readings from Last 3 Encounters:  06/15/21 (!) 152/68  05/08/21 (!) 142/68  03/09/21 130/70  Chronic condition uncontrolled Currently on losartan-hydrochlorothiazide 100-25 mg, 1 tablet daily,  Taking clonidine 0.1 mg daily at bedtime for hot flashes Start amlodipine 2.5 mg daily DASH diet advised engage in moderate exercises at least for 50 minutes Monitor blood pressure at home Smoking cessation education completed Follow-up in 4 weeks  Tobacco abuse Continues to smoke 1 pack a week, need to  quit smoking including risk of MI, lung cancer, COPD discussed with patient, patient verbalized understanding.  Smoking cessation educational material given to patient today,use of  smoking cessation medications discussed with patient patient encouraged to try  OTC nicotine patch for smoking cesaasion. Currently on wellbutrin '150mg'$  daily for depression.

## 2021-06-15 NOTE — Assessment & Plan Note (Addendum)
Continues to smoke 1 pack a week, need to quit smoking including risk of MI, lung cancer, COPD discussed with patient, patient verbalized understanding.  Smoking cessation educational material given to patient today,use of  smoking cessation medications discussed with patient patient encouraged to try  OTC nicotine patch for smoking cesaasion. Currently on wellbutrin '150mg'$  daily for depression.

## 2021-06-19 NOTE — Progress Notes (Signed)
No mammographic evidence of malignancy. A result letter of this screening mammogram will be mailed directly to the patient.  RECOMMENDATION: Screening mammogram in one year

## 2021-06-28 ENCOUNTER — Ambulatory Visit (INDEPENDENT_AMBULATORY_CARE_PROVIDER_SITE_OTHER): Payer: Medicare Other | Admitting: Orthopedic Surgery

## 2021-06-28 ENCOUNTER — Encounter: Payer: Self-pay | Admitting: Orthopedic Surgery

## 2021-06-28 ENCOUNTER — Ambulatory Visit (INDEPENDENT_AMBULATORY_CARE_PROVIDER_SITE_OTHER): Payer: Medicare Other

## 2021-06-28 VITALS — BP 155/55 | HR 76 | Ht <= 58 in | Wt 189.0 lb

## 2021-06-28 DIAGNOSIS — M545 Low back pain, unspecified: Secondary | ICD-10-CM

## 2021-06-28 DIAGNOSIS — M48061 Spinal stenosis, lumbar region without neurogenic claudication: Secondary | ICD-10-CM

## 2021-06-28 NOTE — Addendum Note (Signed)
Addended by: Obie Dredge A on: 06/28/2021 02:34 PM   Modules accepted: Orders

## 2021-06-28 NOTE — Progress Notes (Signed)
Chief Complaint  Patient presents with   New Patient (Initial Visit)    Has been seen before but not within last 3 years   Back Pain    LBP and radiates bilateral hips and down into legs.  Pain is more noticeable after she has done a lot of walking    HPI: This is a 66 year old female with a history of back pain for 5 years previously went to physical therapy but it was many years ago comes in complaining of 5-year history of lower back pain worse in the last 1 to 2 months aggravated by standing walking notices some pain in the left leg running down into the foot associated with some numbness and tingling and weakness at times  She takes naproxen when it is hurting no recent physical therapy  Patient reports smoking history  BMI 39.5  Past Medical History:  Diagnosis Date   Allergy    Anxiety    Arthritis    CKD (chronic kidney disease) stage 3, GFR 30-59 ml/min (HCC) 05/08/2021   Depression    GERD (gastroesophageal reflux disease)    Hyperlipidemia    Hypertension     BP (!) 155/55   Pulse 76   Ht '4\' 10"'$  (1.473 m)   Wt 189 lb (85.7 kg)   BMI 39.50 kg/m    General appearance: Well-developed well-nourished no gross deformities  Cardiovascular normal pulse and perfusion normal color without edema  Neurologically no sensation loss or deficits or pathologic reflexes  Psychological: Awake alert and oriented x3 mood and affect normal  Skin no lacerations or ulcerations no nodularity no palpable masses, no erythema or nodularity  Musculoskeletal: Tenderness lower back increased lordosis increase sagittal plane lordosis.  Strength normal both lower extremities reflex normal both lower extremities straight leg negative both lower extremities  Imaging spine films  A/P  Recommend physical therapy anti-inflammatories plus or minus gabapentin follow-up as needed  Chronic problem/exacerbation/

## 2021-07-08 ENCOUNTER — Other Ambulatory Visit: Payer: Self-pay | Admitting: Nurse Practitioner

## 2021-07-11 ENCOUNTER — Other Ambulatory Visit: Payer: Self-pay | Admitting: Nurse Practitioner

## 2021-07-19 ENCOUNTER — Encounter: Payer: Self-pay | Admitting: *Deleted

## 2021-07-20 ENCOUNTER — Ambulatory Visit (INDEPENDENT_AMBULATORY_CARE_PROVIDER_SITE_OTHER): Payer: Medicare Other | Admitting: Nurse Practitioner

## 2021-07-20 ENCOUNTER — Encounter: Payer: Self-pay | Admitting: Nurse Practitioner

## 2021-07-20 VITALS — BP 137/82 | HR 78 | Ht <= 58 in | Wt 187.0 lb

## 2021-07-20 DIAGNOSIS — Z139 Encounter for screening, unspecified: Secondary | ICD-10-CM | POA: Diagnosis not present

## 2021-07-20 DIAGNOSIS — E7849 Other hyperlipidemia: Secondary | ICD-10-CM | POA: Diagnosis not present

## 2021-07-20 DIAGNOSIS — I10 Essential (primary) hypertension: Secondary | ICD-10-CM

## 2021-07-20 DIAGNOSIS — M47896 Other spondylosis, lumbar region: Secondary | ICD-10-CM | POA: Diagnosis not present

## 2021-07-20 DIAGNOSIS — Z720 Tobacco use: Secondary | ICD-10-CM

## 2021-07-20 NOTE — Assessment & Plan Note (Addendum)
BP Readings from Last 3 Encounters:  07/20/21 137/82  06/28/21 (!) 155/55  06/15/21 (!) 152/68  Chronic condition well-controlled on losartan-hydrochlorothiazide 100-25 mg 1 tablet daily, clonidine 0.1 mg daily at bedtime, amlodipine 2.5 mg daily Continue current medication DASH diet advised engage in regular moderate exercise with at least 150 minutes weekly as tolerated CMP at next visit

## 2021-07-20 NOTE — Assessment & Plan Note (Signed)
Check lipid panel at next visit Continue pravastatin 40 mg daily Avoid fried fatty foods

## 2021-07-26 ENCOUNTER — Ambulatory Visit (HOSPITAL_COMMUNITY): Payer: Medicare Other | Attending: Orthopedic Surgery | Admitting: Physical Therapy

## 2021-07-26 ENCOUNTER — Encounter (HOSPITAL_COMMUNITY): Payer: Self-pay | Admitting: Physical Therapy

## 2021-07-26 DIAGNOSIS — M48061 Spinal stenosis, lumbar region without neurogenic claudication: Secondary | ICD-10-CM | POA: Insufficient documentation

## 2021-07-26 DIAGNOSIS — R2689 Other abnormalities of gait and mobility: Secondary | ICD-10-CM

## 2021-07-26 DIAGNOSIS — M545 Low back pain, unspecified: Secondary | ICD-10-CM | POA: Insufficient documentation

## 2021-07-26 DIAGNOSIS — R29898 Other symptoms and signs involving the musculoskeletal system: Secondary | ICD-10-CM

## 2021-07-26 DIAGNOSIS — M25552 Pain in left hip: Secondary | ICD-10-CM

## 2021-07-26 DIAGNOSIS — M6281 Muscle weakness (generalized): Secondary | ICD-10-CM
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
## 2021-08-03 ENCOUNTER — Ambulatory Visit (HOSPITAL_COMMUNITY): Payer: Medicare Other

## 2021-08-03 DIAGNOSIS — M6281 Muscle weakness (generalized): Secondary | ICD-10-CM

## 2021-08-03 DIAGNOSIS — R29898 Other symptoms and signs involving the musculoskeletal system: Secondary | ICD-10-CM

## 2021-08-03 DIAGNOSIS — M25552 Pain in left hip: Secondary | ICD-10-CM

## 2021-08-03 DIAGNOSIS — R2689 Other abnormalities of gait and mobility: Secondary | ICD-10-CM

## 2021-08-03 DIAGNOSIS — M545 Low back pain, unspecified: Secondary | ICD-10-CM

## 2021-08-09 ENCOUNTER — Telehealth (HOSPITAL_COMMUNITY): Payer: Self-pay | Admitting: Physical Therapy

## 2021-08-09 NOTE — Telephone Encounter (Signed)
Pateint requested to be d/c

## 2021-08-14 ENCOUNTER — Encounter (HOSPITAL_COMMUNITY): Payer: Medicare Other | Admitting: Physical Therapy

## 2021-08-17 ENCOUNTER — Other Ambulatory Visit: Payer: Self-pay | Admitting: Nurse Practitioner

## 2021-08-22 ENCOUNTER — Encounter (HOSPITAL_COMMUNITY): Payer: Medicare Other | Admitting: Physical Therapy

## 2021-08-27 ENCOUNTER — Encounter (HOSPITAL_COMMUNITY): Payer: Medicare Other | Admitting: Physical Therapy

## 2021-08-29 ENCOUNTER — Encounter (HOSPITAL_COMMUNITY): Payer: Medicare Other | Admitting: Physical Therapy

## 2021-09-04 ENCOUNTER — Encounter (HOSPITAL_COMMUNITY): Payer: Medicare Other | Admitting: Physical Therapy

## 2021-09-06 ENCOUNTER — Encounter (HOSPITAL_COMMUNITY): Payer: Medicare Other | Admitting: Physical Therapy

## 2021-09-10 ENCOUNTER — Encounter: Payer: Self-pay | Admitting: *Deleted

## 2021-09-10 NOTE — Patient Instructions (Signed)
Procedure: Colonoscopy  Estimated body mass index is 39.08 kg/m as calculated from the following:   Height as of 07/20/21: '4\' 10"'$  (1.473 m).   Weight as of 07/20/21: 187 lb (84.8 kg).   Have you had a colonoscopy before?  08/21/16, Dr. Oneida Alar  Do you have family history of colon cancer  no  Previous colonoscopy with polyps removed? Yes 2018  Do you have a history colorectal cancer?   no  Are you diabetic?  no  Do you have a prosthetic or mechanical heart valve? no  Do you have a pacemaker/defibrillator?   no  Have you had endocarditis/atrial fibrillation?  no  Do you use supplemental oxygen/CPAP?  no  Have you had joint replacement within the last 12 months?  no  Do you tend to be constipated or have to use laxatives?  no   Do you have history of alcohol use? If yes, how much and how often.  yes  Do you have history or are you using drugs? If yes, what do are you  using?  no  Have you ever had a stroke/heart attack?  no  Have you ever had a heart or other vascular stent placed,?no  Do you take weight loss medication? no  female patients,: have you had a hysterectomy? yes                              are you post menopausal?  yes                              do you still have your menstrual cycle? no    Date of last menstrual period. N/a  Do you take any blood-thinning medications such as: (Plavix, aspirin, Coumadin, Aggrenox, Brilinta, Xarelto, Eliquis, Pradaxa, Savaysa or Effient) aspirin '81mg'$   If yes we need the name, milligram, dosage and who is prescribing doctor:               Current Outpatient Medications  Medication Sig Dispense Refill   amLODipine (NORVASC) 2.5 MG tablet Take 1 tablet (2.5 mg total) by mouth daily. 90 tablet 0   aspirin EC 81 MG tablet Take 81 mg by mouth daily.     Biotin 2500 MCG CAPS Take by mouth. 2 capsules once a day     BLACK COHOSH EXTRACT PO Take 540 mg by mouth daily. (Patient not taking: Reported on 06/15/2021)     buPROPion  (WELLBUTRIN XL) 150 MG 24 hr tablet TAKE 1 TABLET BY MOUTH DAILY 90 tablet 3   Cholecalciferol (VITAMIN D) 2000 units CAPS Take 2,000 Units by mouth daily.     cloNIDine (CATAPRES) 0.1 MG tablet Take 0.1 mg by mouth at bedtime.     Cyanocobalamin (VITAMIN B 12 PO) Take by mouth. Once a day     cycloSPORINE (RESTASIS) 0.05 % ophthalmic emulsion Place 2 drops into both eyes 2 (two) times daily.     FLUoxetine (PROZAC) 20 MG capsule Take 1 capsule (20 mg total) by mouth daily. 90 capsule 3   fluticasone (FLONASE) 50 MCG/ACT nasal spray Place 1 spray into both nostrils daily as needed for allergies or rhinitis. (Patient not taking: Reported on 07/20/2021)     losartan-hydrochlorothiazide (HYZAAR) 100-25 MG tablet TAKE 1 TABLET BY MOUTH DAILY 90 tablet 3   Metamucil Fiber CHEW Chew by mouth. 3 gummies once a day  Multiple Vitamins-Minerals (WOMENS MULTI GUMMIES PO) Take by mouth. Vitafusion 2 gummies once a once     pravastatin (PRAVACHOL) 40 MG tablet Take 1 tablet (40 mg total) by mouth daily. 90 tablet 3   No current facility-administered medications for this visit.    Allergies  Allergen Reactions   Lisinopril Cough   Penicillins Rash    Has patient had a PCN reaction causing immediate rash, facial/tongue/throat swelling, SOB or lightheadedness with hypotension: Yes Has patient had a PCN reaction causing severe rash involving mucus membranes or skin necrosis: No Has patient had a PCN reaction that required hospitalization: No Has patient had a PCN reaction occurring within the last 10 years: No If all of the above answers are "NO", then may proceed with Cephalosporin use.

## 2021-09-20 ENCOUNTER — Other Ambulatory Visit: Payer: Self-pay | Admitting: Nurse Practitioner

## 2021-09-20 DIAGNOSIS — I10 Essential (primary) hypertension: Secondary | ICD-10-CM

## 2021-09-20 NOTE — Progress Notes (Signed)
Last colonoscopy by Dr. Oneida Alar in 2018 with one simple adenoma and 2 hyperplastic. ASA 2.

## 2021-09-21 ENCOUNTER — Telehealth: Payer: Self-pay | Admitting: *Deleted

## 2021-09-21 ENCOUNTER — Encounter: Payer: Self-pay | Admitting: *Deleted

## 2021-09-21 DIAGNOSIS — D126 Benign neoplasm of colon, unspecified: Secondary | ICD-10-CM

## 2021-09-21 MED ORDER — PEG 3350-KCL-NA BICARB-NACL 420 G PO SOLR: 4000.0000 mL | Freq: Once | ORAL | 0 refills | Status: AC

## 2021-09-21 NOTE — Telephone Encounter (Signed)
Colonoscopy , APPROVED Authorization #: [id number]  DOS: 10/23/21-01/21/22

## 2021-09-21 NOTE — Progress Notes (Signed)
Patient has been scheduled for 10/23/21 at 8:45 am  for colonoscopy

## 2021-10-15 ENCOUNTER — Ambulatory Visit (INDEPENDENT_AMBULATORY_CARE_PROVIDER_SITE_OTHER): Payer: Medicare Other

## 2021-10-15 ENCOUNTER — Other Ambulatory Visit (HOSPITAL_COMMUNITY)
Admission: RE | Admit: 2021-10-15 | Discharge: 2021-10-15 | Disposition: A | Discharge status: Home / Self Care | Payer: Medicare Other | Source: Ambulatory Visit | Attending: Internal Medicine | Admitting: Internal Medicine

## 2021-10-15 DIAGNOSIS — D126 Benign neoplasm of colon, unspecified: Secondary | ICD-10-CM | POA: Insufficient documentation

## 2021-10-15 DIAGNOSIS — Z0000 Encounter for general adult medical examination without abnormal findings: Secondary | ICD-10-CM

## 2021-10-15 LAB — BASIC METABOLIC PANEL
Anion gap: 7 (ref 5–15)
BUN: 15 mg/dL (ref 8–23)
CO2: 26 mmol/L (ref 22–32)
Calcium: 9.8 mg/dL (ref 8.9–10.3)
Chloride: 107 mmol/L (ref 98–111)
Creatinine, Ser: 1.01 mg/dL — ABNORMAL HIGH (ref 0.44–1.00)
GFR, Estimated: >60 mL/min (ref >60)
Glucose, Bld: 102 mg/dL — ABNORMAL HIGH (ref 70–99)
Potassium: 4.6 mmol/L (ref 3.5–5.1)
Sodium: 140 mmol/L (ref 135–145)

## 2021-10-15 NOTE — Progress Notes (Signed)
Subjective:   Victoria Graves is a 66 y.o. female who presents for Medicare Annual (Subsequent) preventive examination.  Review of Systems    I connected with  Victoria Graves on 10/15/21 by a audio enabled telemedicine application and verified that I am speaking with the correct person using two identifiers.  Patient Location: Home  Provider Location: Office/Clinic  I discussed the limitations of evaluation and management by telemedicine. The patient expressed understanding and agreed to proceed.        Objective:    There were no vitals filed for this visit. There is no height or weight on file to calculate BMI.     07/26/2021    2:54 PM 08/21/2016    8:45 AM 05/29/2016    3:48 PM 05/05/2014    9:59 PM 11/28/2012    5:32 AM  Advanced Directives  Does Patient Have a Medical Advance Directive? No No No No Patient does not have advance directive;Patient would not like information  Would patient like information on creating a medical advance directive? Yes (MAU/Ambulatory/Procedural Areas - Information given)  Yes (MAU/Ambulatory/Procedural Areas - Information given) No - patient declined information     Current Medications (verified) Outpatient Encounter Medications as of 10/15/2021  Medication Sig   acetaminophen (TYLENOL) 500 MG tablet Take 1,000 mg by mouth daily.   amLODipine (NORVASC) 2.5 MG tablet Take 1 tablet by mouth once daily   aspirin EC 81 MG tablet Take 81 mg by mouth daily.   Biotin w/ Vitamins C & E (HAIR/SKIN/NAILS PO) Take 2 tablets by mouth daily. 2.500 mcg   buPROPion (WELLBUTRIN XL) 150 MG 24 hr tablet TAKE 1 TABLET BY MOUTH DAILY   CALCIUM PO Take 600 mg by mouth in the morning.   Cholecalciferol (VITAMIN D) 2000 units CAPS Take 2,000 Units by mouth in the morning.   cloNIDine (CATAPRES) 0.1 MG tablet Take 0.1 mg by mouth at bedtime.   Cyanocobalamin (VITAMIN B 12 PO) Take 5,000 Units by mouth daily. Once a day   cycloSPORINE (RESTASIS) 0.05 % ophthalmic  emulsion Place 2 drops into both eyes daily.   docusate sodium (COLACE) 100 MG capsule Take 100 mg by mouth daily.   FLUoxetine (PROZAC) 20 MG capsule Take 1 capsule (20 mg total) by mouth daily.   fluticasone (FLONASE) 50 MCG/ACT nasal spray Place 2 sprays into both nostrils daily as needed for allergies.   losartan-hydrochlorothiazide (HYZAAR) 100-25 MG tablet TAKE 1 TABLET BY MOUTH DAILY   Metamucil Fiber CHEW Chew 15 mg by mouth daily. 3 gummies 5 mg each   Multiple Vitamins-Minerals (WOMENS MULTI GUMMIES PO) Take 2 tablets by mouth daily at 2 am. Vitafusion 2 gummies   naproxen (NAPROSYN) 500 MG tablet Take 500 mg by mouth daily as needed for mild pain or moderate pain.   pravastatin (PRAVACHOL) 40 MG tablet Take 1 tablet (40 mg total) by mouth daily.   No facility-administered encounter medications on file as of 10/15/2021.    Allergies (verified) Lisinopril and Penicillins   History: Past Medical History:  Diagnosis Date   Allergy    Anxiety    Arthritis    CKD (chronic kidney disease) stage 3, GFR 30-59 ml/min ([facility]) 05/08/2021   Depression    GERD (gastroesophageal reflux disease)    Hyperlipidemia    Hypertension    Past Surgical History:  Procedure Laterality Date   ABDOMINAL HYSTERECTOMY  1999   fibroid   COLONOSCOPY N/A 08/21/2016   Procedure: COLONOSCOPY;  Surgeon:  Fields, Marga Melnick, MD;  [facility];  Service: Endoscopy;  Laterality: N/A;  9:30 AM   POLYPECTOMY  08/21/2016   Procedure: POLYPECTOMY;  Surgeon: Danie Binder, MD;  [facility];  Service: Endoscopy;;  ascending colon and sigmoid x2   Family History  Problem Relation Age of Onset   Hypertension Mother    Arthritis Mother    Stomach cancer Mother    Heart disease Maternal Grandmother    Kidney disease Maternal Grandmother    Breast cancer Neg Hx    Cancer - Lung Neg Hx    Social History   Socioeconomic History   Marital status: Divorced    Spouse name: Not on file    Number of children: 0   Years of education: 10   Highest education level: Not on file  Occupational History   Occupation: disability    Comment: back and hip  Tobacco Use   Smoking status: Every Day    Packs/day: 0.10    Types: Cigarettes   Smokeless tobacco: Never   Tobacco comments:    Smokes a pack a week. Smoking since age 66.   Substance and Sexual Activity   Alcohol use: Yes    Comment: occasional   Drug use: No   Sexual activity: Yes    Birth control/protection: Surgical    Comment: total hystetrectomy  Other Topics Concern   Not on file  Social History Narrative   Lives with friend and cousin who has mental disability   Social Determinants of Health   Financial Resource Strain: Not on file  Food Insecurity: Not on file  Transportation Needs: Not on file  Physical Activity: Not on file  Stress: Not on file  Social Connections: Not on file    Tobacco Counseling Ready to quit: Not Answered Counseling given: Not Answered Tobacco comments: Smokes a pack a week. Smoking since age 40.    Clinical Intake:   Diabetic?no   Activities of Daily Living     No data to display           Patient Care Team: Renee Rival, FNP as PCP - General (Nurse Practitioner)  Indicate any recent Medical Services you may have received from other than [facility] providers in the past year (date may be approximate).     Assessment:   This is a routine wellness examination for [place].  Hearing/Vision screen No results found.  Dietary issues and exercise activities discussed:     Goals Addressed   None   Depression Screen    07/20/2021    3:51 PM 06/15/2021    1:28 PM 05/08/2021    3:09 PM 03/09/2021    2:41 PM 02/06/2017    1:54 PM 08/01/2016    3:52 PM 05/29/2016    3:48 PM  PHQ 2/9 Scores  PHQ - 2 Score 0 0 0 0 0 0 2  PHQ- 9 Score       6    Fall Risk    07/20/2021    3:51 PM 06/15/2021    1:27 PM 05/08/2021    3:09 PM 03/09/2021    2:41 PM 02/06/2017    1:54  PM  Fall Risk   Falls in the past year? 0 0 0 0 No  Number falls in past yr: 0 0 0 0   Injury with Fall? 0 0 0 0   Risk for fall due to : No Fall Risks No Fall Risks No Fall Risks No  Fall Risks   Follow up Falls evaluation completed Falls evaluation completed Falls evaluation completed Falls evaluation completed     [place]:  Any stairs in or around the home? No  If so, are there any without handrails?  N/a Home free of loose throw rugs in walkways, pet beds, electrical cords, etc? Yes  Adequate lighting in your home to reduce risk of falls? Yes   ASSISTIVE DEVICES UTILIZED TO PREVENT FALLS:  Life alert? No  Use of a cane, walker or w/c? Yes cane Grab bars in the bathroom? Yes  Shower chair or bench in shower? Yes  Elevated toilet seat or a handicapped toilet? No          05/29/2016    3:52 PM  6CIT Screen  What Year? 0 points  What month? 0 points  What time? 0 points  Count back from 20 0 points  Months in reverse 0 points  Repeat phrase 0 points  Total Score 0 points    Immunizations Immunization History  Administered Date(s) Administered   Influenza,inj,Quad PF,6+ Mos 11/29/2015, 02/06/2017   Influenza-Unspecified 11/28/2020   PFIZER Comirnaty(Gray Top)Covid-19 Tri-Sucrose Vaccine 03/02/2020   PFIZER(Purple Top)SARS-COV-2 Vaccination 03/23/2020   Tdap 11/29/2015    TDAP status: Up to date  Flu Vaccine status: Due, Education has been provided regarding the importance of this vaccine. Advised may receive this vaccine at local pharmacy or Health Dept. Aware to provide a copy of the vaccination record if obtained from local pharmacy or Health Dept. Verbalized acceptance and understanding.  Pneumococcal vaccine status: Due, Education has been provided regarding the importance of this vaccine. Advised may receive this vaccine at local pharmacy or Health Dept. Aware to provide a copy of the vaccination record if obtained from local  pharmacy or Health Dept. Verbalized acceptance and understanding.  Covid-19 vaccine status: Completed vaccines  Qualifies for Shingles Vaccine? Yes   Zostavax completed No   Shingrix Completed?: No.    Education has been provided regarding the importance of this vaccine. Patient has been advised to call insurance company to determine out of pocket expense if they have not yet received this vaccine. Advised may also receive vaccine at local pharmacy or Health Dept. Verbalized acceptance and understanding.  Screening Tests Health Maintenance  Topic Date Due   Pneumonia Vaccine 63+ Years old (1 - PCV) Never done   Zoster Vaccines- Shingrix (1 of 2) Never done   INFLUENZA VACCINE  08/28/2021   MAMMOGRAM  06/16/2023   TETANUS/TDAP  11/28/2025   COLONOSCOPY (Pts 45-64yr Insurance coverage will need to be confirmed)  08/22/2026   DEXA SCAN  Completed   Hepatitis C Screening  Completed   HIV Screening  Completed   HPV VACCINES  Aged Out   PAP SMEAR-Modifier  Discontinued   COVID-19 Vaccine  Discontinued    Health Maintenance  Health Maintenance Due  Topic Date Due   Pneumonia Vaccine 65 Years old (1 - PCV) Never done   Zoster Vaccines- Shingrix (1 of 2) Never done   INFLUENZA VACCINE  08/28/2021    Colorectal cancer screening: Type of screening: Colonoscopy. Completed 08/21/16. Repeat every 10 years  Mammogram status: Completed 06/15/21. Repeat every year  Bone Density status: Completed 11/06/20. Results reflect: Bone density results: OSTEOPENIA. Repeat every 2 years.  Lung Cancer Screening: (Low Dose CT Chest recommended if Age 65-80years, 30 pack-year currently smoking OR have quit w/in 15years.) does not qualify.   Lung Cancer Screening Referral:  no  Additional Screening:  Hepatitis C Screening: does qualify; Completed 11/29/15  Vision Screening: Recommended annual ophthalmology exams for early detection of glaucoma and other disorders of the eye. Is the patient up to  date with their annual eye exam?  Yes  Who is the provider or what is the name of the office in which the patient attends annual eye exams? N/a If pt is not established with a provider, would they like to be referred to a provider to establish care? No .   Dental Screening: Recommended annual dental exams for proper oral hygiene  Community Resource Referral / Chronic Care Management: CRR required this visit?  No   CCM required this visit?  No      Plan:     I have personally reviewed and noted the following in the patient's chart:   Medical and social history Use of alcohol, tobacco or illicit drugs  Current medications and supplements including opioid prescriptions. Patient is not currently taking opioid prescriptions. Functional ability and status Nutritional status Physical activity Advanced directives List of other physicians Hospitalizations, surgeries, and ER visits in previous 12 months Vitals Screenings to include cognitive, depression, and falls Referrals and appointments  In addition, I have reviewed and discussed with patient certain preventive protocols, quality metrics, and best practice recommendations. A written personalized care plan for preventive services as well as general preventive health recommendations were provided to patient.     Quentin Angst, [place]   10/15/2021

## 2021-10-15 NOTE — Patient Instructions (Signed)
  Victoria Graves , Thank you for taking time to come for your Medicare Wellness Visit. I appreciate your ongoing commitment to your health goals. Please review the following plan we discussed and let me know if I can assist you in the future.   These are the goals we discussed:  Goals      Blood Pressure < 140/90     Exercise 3x per week (30 min per time)     Recommend starting a routine exercise program at least 3 days a week for 30-45 minutes at a time as tolerated.       Patient Stated     None     Quit Smoking     STOP SMOKING     Weight (lb) < 150 lb (68 kg)        This is a list of the screening recommended for you and due dates:  Health Maintenance  Topic Date Due   Pneumonia Vaccine (1 - PCV) Never done   Zoster (Shingles) Vaccine (1 of 2) Never done   Flu Shot  08/28/2021   Mammogram  06/16/2023   Tetanus Vaccine  11/28/2025   Colon Cancer Screening  08/22/2026   DEXA scan (bone density measurement)  Completed   Hepatitis C Screening: USPSTF Recommendation to screen - Ages 60-79 yo.  Completed   HIV Screening  Completed   HPV Vaccine  Aged Out   Pap Smear  Discontinued   COVID-19 Vaccine  Discontinued

## 2021-10-18 ENCOUNTER — Other Ambulatory Visit: Payer: Self-pay

## 2021-10-18 ENCOUNTER — Encounter (HOSPITAL_COMMUNITY)
Admission: RE | Admit: 2021-10-18 | Discharge: 2021-10-18 | Disposition: A | Discharge status: Home / Self Care | Payer: Medicare Other | Source: Ambulatory Visit | Attending: Internal Medicine | Admitting: Internal Medicine

## 2021-10-18 ENCOUNTER — Other Ambulatory Visit: Payer: Self-pay | Admitting: *Deleted

## 2021-10-18 ENCOUNTER — Encounter (HOSPITAL_COMMUNITY): Payer: Self-pay

## 2021-10-18 ENCOUNTER — Encounter: Payer: Self-pay | Admitting: *Deleted

## 2021-10-18 MED ORDER — NA SULFATE-K SULFATE-MG SULF 17.5-3.13-1.6 GM/177ML PO SOLN: ORAL | 0 refills | Status: DC

## 2021-10-23 ENCOUNTER — Ambulatory Visit (HOSPITAL_COMMUNITY): Payer: Medicare Other | Admitting: Anesthesiology

## 2021-10-23 ENCOUNTER — Ambulatory Visit (HOSPITAL_COMMUNITY)
Admission: RE | Admit: 2021-10-23 | Discharge: 2021-10-23 | Disposition: A | Discharge status: Home / Self Care | Payer: Medicare Other | Attending: Internal Medicine | Admitting: Internal Medicine

## 2021-10-23 ENCOUNTER — Encounter (HOSPITAL_COMMUNITY)
Admission: RE | Disposition: A | Discharge status: Home / Self Care | Payer: Self-pay | Source: Home / Self Care | Attending: Internal Medicine

## 2021-10-23 ENCOUNTER — Ambulatory Visit (HOSPITAL_BASED_OUTPATIENT_CLINIC_OR_DEPARTMENT_OTHER): Payer: Medicare Other | Admitting: Anesthesiology

## 2021-10-23 ENCOUNTER — Encounter (HOSPITAL_COMMUNITY): Payer: Self-pay

## 2021-10-23 ENCOUNTER — Other Ambulatory Visit: Payer: Self-pay

## 2021-10-23 DIAGNOSIS — Z86010 Personal history of colonic polyps: Secondary | ICD-10-CM | POA: Diagnosis not present

## 2021-10-23 DIAGNOSIS — K648 Other hemorrhoids: Secondary | ICD-10-CM | POA: Diagnosis not present

## 2021-10-23 DIAGNOSIS — D123 Benign neoplasm of transverse colon: Secondary | ICD-10-CM | POA: Insufficient documentation

## 2021-10-23 DIAGNOSIS — Z09 Encounter for follow-up examination after completed treatment for conditions other than malignant neoplasm: Secondary | ICD-10-CM | POA: Diagnosis not present

## 2021-10-23 DIAGNOSIS — F418 Other specified anxiety disorders: Secondary | ICD-10-CM

## 2021-10-23 DIAGNOSIS — F17210 Nicotine dependence, cigarettes, uncomplicated: Secondary | ICD-10-CM | POA: Insufficient documentation

## 2021-10-23 DIAGNOSIS — I129 Hypertensive chronic kidney disease with stage 1 through stage 4 chronic kidney disease, or unspecified chronic kidney disease: Secondary | ICD-10-CM | POA: Diagnosis not present

## 2021-10-23 DIAGNOSIS — K5790 Diverticulosis of intestine, part unspecified, without perforation or abscess without bleeding: Secondary | ICD-10-CM | POA: Diagnosis not present

## 2021-10-23 DIAGNOSIS — K635 Polyp of colon: Secondary | ICD-10-CM | POA: Diagnosis not present

## 2021-10-23 DIAGNOSIS — K5730 Diverticulosis of large intestine without perforation or abscess without bleeding: Secondary | ICD-10-CM | POA: Insufficient documentation

## 2021-10-23 DIAGNOSIS — N183 Chronic kidney disease, stage 3 unspecified: Secondary | ICD-10-CM | POA: Insufficient documentation

## 2021-10-23 DIAGNOSIS — Z1211 Encounter for screening for malignant neoplasm of colon: Secondary | ICD-10-CM | POA: Insufficient documentation

## 2021-10-23 HISTORY — PX: COLONOSCOPY WITH PROPOFOL: SHX5780

## 2021-10-23 HISTORY — PX: POLYPECTOMY: SHX5525

## 2021-10-23 SURGERY — COLONOSCOPY WITH PROPOFOL
Anesthesia: General

## 2021-10-23 MED ORDER — LIDOCAINE HCL (CARDIAC) PF 100 MG/5ML IV SOSY
PREFILLED_SYRINGE | INTRAVENOUS | Status: DC | PRN
  Administered 2021-10-23: 50 mg via INTRAVENOUS

## 2021-10-23 MED ORDER — LACTATED RINGERS IV SOLN: INTRAVENOUS | Status: DC | PRN

## 2021-10-23 MED ORDER — PROPOFOL 10 MG/ML IV BOLUS
INTRAVENOUS | Status: DC | PRN
  Administered 2021-10-23: 100 mg via INTRAVENOUS

## 2021-10-23 MED ORDER — PROPOFOL 500 MG/50ML IV EMUL
INTRAVENOUS | Status: DC | PRN
  Administered 2021-10-23: 150 ug/kg/min via INTRAVENOUS

## 2021-10-23 NOTE — H&P (Signed)
Primary Care Physician:  Renee Rival, FNP Primary Gastroenterologist:  Dr. Abbey Chatters  Pre-Procedure History & Physical: HPI:  Victoria Graves is a 66 y.o. female is here for a colonoscopy to be performed for surveillance purposes, personal history of adenomatous colon polyp 2018.  Past Medical History:  Diagnosis Date   Allergy    Anxiety    Arthritis    CKD (chronic kidney disease) stage 3, GFR 30-59 ml/min (HCC) 05/08/2021   Depression    GERD (gastroesophageal reflux disease)    Hyperlipidemia    Hypertension     Past Surgical History:  Procedure Laterality Date   ABDOMINAL HYSTERECTOMY  1999   fibroid   COLONOSCOPY N/A 08/21/2016   Procedure: COLONOSCOPY;  Surgeon: Danie Binder, MD;  [facility]: [facility];  Service: Endoscopy;  Laterality: N/A;  9:30 AM   POLYPECTOMY  08/21/2016   Procedure: POLYPECTOMY;  Surgeon: Danie Binder, MD;  Location: [facility];  Service: Endoscopy;;  ascending colon and sigmoid x2    Prior to Admission medications   Medication Sig Start Date End Date Taking? Authorizing Provider  amLODipine (NORVASC) 2.5 MG tablet Take 1 tablet by mouth once daily 09/20/21  Yes Paseda, Dewaine Conger, FNP  aspirin EC 81 MG tablet Take 81 mg by mouth daily.   Yes [provider]  Biotin w/ Vitamins C & E (HAIR/SKIN/NAILS PO) Take 2 tablets by mouth daily. 2.500 mcg   Yes [provider]  buPROPion (WELLBUTRIN XL) 150 MG 24 hr tablet TAKE 1 TABLET BY MOUTH DAILY 08/17/21  Yes Paseda, Folashade R, FNP  CALCIUM PO Take 600 mg by mouth in the morning.   Yes [provider]  Cholecalciferol (VITAMIN D) 2000 units CAPS Take 2,000 Units by mouth in the morning.   Yes [provider]  cloNIDine (CATAPRES) 0.1 MG tablet Take 0.1 mg by mouth at bedtime. 12/28/20  Yes [provider]  Cyanocobalamin (VITAMIN B 12 PO) Take 5,000 Units by mouth daily. Once a day   Yes [provider]  cycloSPORINE (RESTASIS) 0.05 %  ophthalmic emulsion Place 2 drops into both eyes daily.   Yes [provider]  docusate sodium (COLACE) 100 MG capsule Take 100 mg by mouth daily.   Yes [provider]  FLUoxetine (PROZAC) 20 MG capsule Take 1 capsule (20 mg total) by mouth daily. 06/06/21  Yes Paseda, Dewaine Conger, FNP  losartan-hydrochlorothiazide (HYZAAR) 100-25 MG tablet TAKE 1 TABLET BY MOUTH DAILY 07/11/21  Yes Paseda, Dewaine Conger, FNP  Metamucil Fiber CHEW Chew 15 mg by mouth daily. 3 gummies 5 mg each   Yes [provider]  Multiple Vitamins-Minerals (WOMENS MULTI GUMMIES PO) Take 2 tablets by mouth daily at 2 am. Vitafusion 2 gummies   Yes [provider]  pravastatin (PRAVACHOL) 40 MG tablet Take 1 tablet (40 mg total) by mouth daily. 06/14/21  Yes Paseda, Dewaine Conger, FNP  acetaminophen (TYLENOL) 500 MG tablet Take 1,000 mg by mouth daily.    [provider]  fluticasone (FLONASE) 50 MCG/ACT nasal spray Place 2 sprays into both nostrils daily as needed for allergies.    [provider]  Na Sulfate-K Sulfate-Mg Sulf 17.5-3.13-1.6 GM/177ML SOLN As directed 10/18/21   Eloise Harman, DO  naproxen (NAPROSYN) 500 MG tablet Take 500 mg by mouth daily as needed for mild pain or moderate pain.    [provider]    Allergies as of 09/21/2021 - Review Complete 07/26/2021  Allergen  Reaction Noted   Lisinopril Cough 11/01/2015   Penicillins Rash 11/27/2012    Family History  Problem Relation Age of Onset   Hypertension Mother    Arthritis Mother    Stomach cancer Mother    Heart disease Maternal Grandmother    Kidney disease Maternal Grandmother    Breast cancer Neg Hx    Cancer - Lung Neg Hx     Social History   Socioeconomic History   Marital status: Divorced    Spouse name: Not on file   Number of children: 0   Years of education: 10   Highest education level: Not on file  Occupational History   Occupation: disability    Comment: back and hip   Tobacco Use   Smoking status: Every Day    Packs/day: 0.10    Years: 20.00    Total pack years: 2.00    Types: Cigarettes   Smokeless tobacco: Never   Tobacco comments:    Smokes a pack a week. Smoking since age 7.   Vaping Use   Vaping Use: Never used  Substance and Sexual Activity   Alcohol use: Yes    Comment: occasional   Drug use: No   Sexual activity: Yes    Birth control/protection: Surgical    Comment: total hystetrectomy  Other Topics Concern   Not on file  Social History Narrative   Lives with friend and cousin who has mental disability   Social Determinants of Health   Financial Resource Strain: Low Risk  (10/15/2021)   Overall Financial Resource Strain (CARDIA)    Difficulty of Paying Living Expenses: Not hard at all  Food Insecurity: No Food Insecurity (10/15/2021)   Hunger Vital Sign    Worried About Running Out of Food in the Last Year: Never true    Ran Out of Food in the Last Year: Never true  Transportation Needs: No Transportation Needs (10/15/2021)   PRAPARE - Hydrologist (Medical): No    Lack of Transportation (Non-Medical): No  Physical Activity: Insufficiently Active (10/15/2021)   Exercise Vital Sign    Days of Exercise per Week: 3 days    Minutes of Exercise per Session: 30 min  Stress: No Stress Concern Present (10/15/2021)   [place]    Feeling of Stress : Not at all  Social Connections: Moderately Integrated (10/15/2021)   Social Connection and Isolation Panel [NHANES]    Frequency of Communication with Friends and Family: More than three times a week    Frequency of Social Gatherings with Friends and Family: More than three times a week    Attends Religious Services: More than 4 times per year    Active Member of [employer] or Organizations: Yes    Attends Music therapist: More than 4 times per year    Marital Status: Divorced  Intimate  Partner Violence: Not At Risk (10/15/2021)   Humiliation, Afraid, Rape, and Kick questionnaire    Fear of Current or Ex-Partner: No    Emotionally Abused: No    Physically Abused: No    Sexually Abused: No    Review of Systems: See HPI, otherwise negative ROS  Physical Exam: Vital signs in last 24 hours: Weight:  [81.6 kg] 81.6 kg (09/26 0753)   General:   Alert,  Well-developed, well-nourished, pleasant and cooperative in NAD Head:  Normocephalic and atraumatic. Eyes:  Sclera clear, no icterus.   Conjunctiva pink. Ears:  Normal auditory acuity. Nose:  No deformity, discharge,  or lesions. Mouth:  No deformity or lesions, dentition normal. Neck:  Supple; no masses or thyromegaly. Lungs:  Clear throughout to auscultation.   No wheezes, crackles, or rhonchi. No acute distress. Heart:  Regular rate and rhythm; no murmurs, clicks, rubs,  or gallops. Abdomen:  Soft, nontender and nondistended. No masses, hepatosplenomegaly or hernias noted. Normal bowel sounds, without guarding, and without rebound.   Msk:  Symmetrical without gross deformities. Normal posture. Extremities:  Without clubbing or edema. Neurologic:  Alert and  oriented x4;  grossly normal neurologically. Skin:  Intact without significant lesions or rashes. Cervical Nodes:  No significant cervical adenopathy. Psych:  Alert and cooperative. Normal mood and affect.  Impression/Plan: SHONDRA CAPPS is here for a colonoscopy to be performed for surveillance purposes, personal history of adenomatous colon polyp 2018.  The risks of the procedure including infection, bleed, or perforation as well as benefits, limitations, alternatives and imponderables have been reviewed with the patient. Questions have been answered. All parties agreeable.

## 2021-10-23 NOTE — Op Note (Signed)
[facility] Patient Name: Victoria Graves Procedure Date: 10/23/2021 8:41 AM MRN: [id number] Date of Birth: 01-Jun-1955 Attending MD: Elon Alas. Abbey Chatters DO CSN: [id number] Age: 66 Admit Type: Outpatient Procedure:                Colonoscopy Indications:              Surveillance: Personal history of adenomatous                            polyps on last colonoscopy 5 years ago Providers:                Elon Alas. Abbey Chatters, DO, Caprice Kluver, Aram Candela Referring MD:              Medicines:                See the Anesthesia note for documentation of the                            administered medications Complications:            No immediate complications. Estimated Blood Loss:     Estimated blood loss was minimal. Procedure:                Pre-Anesthesia Assessment:                           - The anesthesia plan was to use monitored                            anesthesia care (MAC).                           After obtaining informed consent, the colonoscope                            was passed under direct vision. Throughout the                            procedure, the patient's blood pressure, pulse, and                            oxygen saturations were monitored continuously. The                            [id number]-HQ190L ([id number]) scope was introduced through                            the anus and advanced to the the cecum, identified                            by appendiceal orifice and ileocecal valve. The                            colonoscopy was performed without difficulty. The                            patient  tolerated the procedure well. The quality                            of the bowel preparation was evaluated using the                            BBPS [facility] Bowel Preparation Scale) with scores                            of: Right Colon = 3, Transverse Colon = 3 and Left                            Colon = 3 (entire mucosa seen well with no residual                             staining, small fragments of stool or opaque                            liquid). The total BBPS score equals 9. Scope In: 8:52:51 AM Scope Out: 9:08:43 AM Scope Withdrawal Time: 0 hours 13 minutes 38 seconds  Total Procedure Duration: 0 hours 15 minutes 52 seconds  Findings:      Hemorrhoids were found on perianal exam.      Non-bleeding internal hemorrhoids were found during endoscopy.      Scattered small and large-mouthed diverticula were found in the entire       colon.      Three sessile polyps were found in the sigmoid colon, transverse colon       and cecum. The polyps were 3 to 6 mm in size. These polyps were removed       with a cold snare. Resection and retrieval were complete.      The exam was otherwise without abnormality. Impression:               - Hemorrhoids found on perianal exam.                           - Non-bleeding internal hemorrhoids.                           - Diverticulosis in the entire examined colon.                           - Three 3 to 6 mm polyps in the sigmoid colon, in                            the transverse colon and in the cecum, removed with                            a cold snare. Resected and retrieved.                           - The examination was otherwise normal. Moderate Sedation:      Per Anesthesia Care Recommendation:           -  Patient has a contact number available for                            emergencies. The signs and symptoms of potential                            delayed complications were discussed with the                            patient. Return to normal activities tomorrow.                            Written discharge instructions were provided to the                            patient.                           - Resume previous diet.                           - Continue present medications.                           - Await pathology results.                           - Repeat colonoscopy in 5 years for  surveillance                            and history of polyps                           - Return to GI clinic PRN. Procedure Code(s):        --- Professional ---                           [id number], Colonoscopy, flexible; with removal of                            tumor(s), polyp(s), or other lesion(s) by snare                            technique Diagnosis Code(s):        --- Professional ---                           Z86.010, Personal history of colonic polyps                           K64.8, Other hemorrhoids                           K63.5, Polyp of colon                           K57.30, Diverticulosis of large intestine without  perforation or abscess without bleeding CPT copyright 2019 American Medical Association. All rights reserved. The codes documented in this report are preliminary and upon coder review may  be revised to meet current compliance requirements. Elon Alas. Abbey Chatters, DO [place] Abbey Chatters, DO 10/23/2021 9:11:41 AM This report has been signed electronically. Number of Addenda: 0

## 2021-10-23 NOTE — Transfer of Care (Signed)
Immediate Anesthesia Transfer of Care Note  Patient: Victoria Graves  Procedure(s) Performed: COLONOSCOPY WITH PROPOFOL POLYPECTOMY  Patient Location: Short Stay  Anesthesia Type:General  Level of Consciousness: drowsy  Airway & Oxygen Therapy: Patient Spontanous Breathing  Post-op Assessment: Report given to RN and Post -op Vital signs reviewed and stable  Post vital signs: Reviewed and stable  Last Vitals:  Vitals Value Taken Time  BP 85/51 10/23/21 0912  Temp 36.4 C 10/23/21 0912  Pulse 78 10/23/21 0912  Resp 20 10/23/21 0912  SpO2 92%     Last Pain:  Vitals:   10/23/21 0912  TempSrc: Oral  PainSc:       Patients Stated Pain Goal: 5 (83/43/73 5789)  Complications: No notable events documented.

## 2021-10-23 NOTE — Discharge Instructions (Addendum)
  Colonoscopy Discharge Instructions  Read the instructions outlined below and refer to this sheet in the next few weeks. These discharge instructions provide you with general information on caring for yourself after you leave the hospital. Your doctor may also give you specific instructions. While your treatment has been planned according to the most current medical practices available, unavoidable complications occasionally occur.   ACTIVITY You may resume your regular activity, but move at a slower pace for the next 24 hours.  Take frequent rest periods for the next 24 hours.  Walking will help get rid of the air and reduce the bloated feeling in your belly (abdomen).  No driving for 24 hours (because of the medicine (anesthesia) used during the test).   Do not sign any important legal documents or operate any machinery for 24 hours (because of the anesthesia used during the test).  NUTRITION Drink plenty of fluids.  You may resume your normal diet as instructed by your doctor.  Begin with a light meal and progress to your normal diet. Heavy or fried foods are harder to digest and may make you feel sick to your stomach (nauseated).  Avoid alcoholic beverages for 24 hours or as instructed.  MEDICATIONS You may resume your normal medications unless your doctor tells you otherwise.  WHAT YOU CAN EXPECT TODAY Some feelings of bloating in the abdomen.  Passage of more gas than usual.  Spotting of blood in your stool or on the toilet paper.  IF YOU HAD POLYPS REMOVED DURING THE COLONOSCOPY: No aspirin products for 7 days or as instructed.  No alcohol for 7 days or as instructed.  Eat a soft diet for the next 24 hours.  FINDING OUT THE RESULTS OF YOUR TEST Not all test results are available during your visit. If your test results are not back during the visit, make an appointment with your caregiver to find out the results. Do not assume everything is normal if you have not heard from your  caregiver or the medical facility. It is important for you to follow up on all of your test results.  SEEK IMMEDIATE MEDICAL ATTENTION IF: You have more than a spotting of blood in your stool.  Your belly is swollen (abdominal distention).  You are nauseated or vomiting.  You have a temperature over 101.  You have abdominal pain or discomfort that is severe or gets worse throughout the day.   Your colonoscopy revealed 3 polyp(s) which I removed successfully. Await pathology results, my office will contact you. I recommend repeating colonoscopy in 5 years for surveillance purposes.   You also have diverticulosis and internal hemorrhoids. I would recommend increasing fiber in your diet or adding OTC Benefiber/Metamucil. Be sure to drink at least 4 to 6 glasses of water daily. Follow-up with GI as needed.   I hope you have a great rest of your week!  Charles K. Carver, D.O. Gastroenterology and Hepatology [facility]  

## 2021-10-23 NOTE — Anesthesia Preprocedure Evaluation (Signed)
Anesthesia Evaluation  Patient identified by MRN, date of birth, ID band Patient awake    Reviewed: Allergy & Precautions, H&P , NPO status , Patient's Chart, lab work & pertinent test results, reviewed documented beta blocker date and time   Airway Mallampati: II  TM Distance: >3 FB Neck ROM: full    Dental no notable dental hx.    Pulmonary neg pulmonary ROS, Current Smoker and Patient abstained from smoking.,    Pulmonary exam normal breath sounds clear to auscultation       Cardiovascular Exercise Tolerance: Good hypertension, negative cardio ROS   Rhythm:regular Rate:Normal     Neuro/Psych PSYCHIATRIC DISORDERS Anxiety Depression negative neurological ROS     GI/Hepatic Neg liver ROS, GERD  Medicated,  Endo/Other  negative endocrine ROS  Renal/GU CRFRenal disease  negative genitourinary   Musculoskeletal   Abdominal   Peds  Hematology negative hematology ROS (+)   Anesthesia Other Findings   Reproductive/Obstetrics negative OB ROS                             Anesthesia Physical Anesthesia Plan  ASA: 3  Anesthesia Plan: General   Post-op Pain Management:    Induction:   PONV Risk Score and Plan: Propofol infusion  Airway Management Planned:   Additional Equipment:   Intra-op Plan:   Post-operative Plan:   Informed Consent: I have reviewed the patients History and Physical, chart, labs and discussed the procedure including the risks, benefits and alternatives for the proposed anesthesia with the patient or authorized representative who has indicated his/her understanding and acceptance.     Dental Advisory Given  Plan Discussed with: CRNA  Anesthesia Plan Comments:         Anesthesia Quick Evaluation

## 2021-10-24 LAB — SURGICAL PATHOLOGY

## 2021-10-25 NOTE — Anesthesia Postprocedure Evaluation (Signed)
Anesthesia Post Note  Patient: Victoria Graves  Procedure(s) Performed: COLONOSCOPY WITH PROPOFOL POLYPECTOMY  Patient location during evaluation: Phase II Anesthesia Type: General Level of consciousness: awake Pain management: pain level controlled Vital Signs Assessment: post-procedure vital signs reviewed and stable Respiratory status: spontaneous breathing and respiratory function stable Cardiovascular status: blood pressure returned to baseline and stable Postop Assessment: no headache and no apparent nausea or vomiting Anesthetic complications: no Comments: Late entry   No notable events documented.   Last Vitals:  Vitals:   10/23/21 0912 10/23/21 0915  BP: (!) 85/51 (!) 105/59  Pulse: 78   Resp: 20   Temp: (!) 36.4 C   SpO2:  94%    Last Pain:  Vitals:   10/24/21 1445  TempSrc:   PainSc: 0-No pain                 Louann Sjogren

## 2021-10-30 ENCOUNTER — Encounter (HOSPITAL_COMMUNITY): Payer: Self-pay | Admitting: Internal Medicine

## 2021-11-13 DIAGNOSIS — I10 Essential (primary) hypertension: Secondary | ICD-10-CM | POA: Diagnosis not present

## 2021-11-13 DIAGNOSIS — R7303 Prediabetes: Secondary | ICD-10-CM | POA: Diagnosis not present

## 2021-11-13 DIAGNOSIS — E7849 Other hyperlipidemia: Secondary | ICD-10-CM | POA: Diagnosis not present

## 2021-11-13 DIAGNOSIS — Z139 Encounter for screening, unspecified: Secondary | ICD-10-CM | POA: Diagnosis not present

## 2021-11-14 LAB — CMP14+EGFR
ALT: 17 IU/L (ref 0–32)
AST: 20 IU/L (ref 0–40)
Albumin/Globulin Ratio: 1.9 (ref 1.2–2.2)
Albumin: 4 g/dL (ref 3.9–4.9)
Alkaline Phosphatase: 60 IU/L (ref 44–121)
BUN/Creatinine Ratio: 13 (ref 12–28)
BUN: 12 mg/dL (ref 8–27)
Bilirubin Total: 0.2 mg/dL (ref 0.0–1.2)
CO2: 26 mmol/L (ref 20–29)
Calcium: 9.6 mg/dL (ref 8.7–10.3)
Chloride: 103 mmol/L (ref 96–106)
Creatinine, Ser: 0.92 mg/dL (ref 0.57–1.00)
Globulin, Total: 2.1 g/dL (ref 1.5–4.5)
Glucose: 109 mg/dL — ABNORMAL HIGH (ref 70–99)
Potassium: 3.7 mmol/L (ref 3.5–5.2)
Sodium: 143 mmol/L (ref 134–144)
Total Protein: 6.1 g/dL (ref 6.0–8.5)
eGFR: 69 mL/min/{1.73_m2} (ref >59)

## 2021-11-14 LAB — HEMOGLOBIN A1C
Est. average glucose Bld gHb Est-mCnc: 126 mg/dL
Hgb A1c MFr Bld: 6 % — ABNORMAL HIGH (ref 4.8–5.6)

## 2021-11-14 LAB — LIPID PANEL
Chol/HDL Ratio: 2.2 ratio (ref 0.0–4.4)
Cholesterol, Total: 153 mg/dL (ref 100–199)
HDL: 69 mg/dL (ref >39)
LDL Chol Calc (NIH): 72 mg/dL (ref 0–99)
Triglycerides: 55 mg/dL (ref 0–149)
VLDL Cholesterol Cal: 12 mg/dL (ref 5–40)

## 2021-11-14 LAB — TSH: TSH: 4.32 u[IU]/mL (ref 0.450–4.500)

## 2021-11-14 NOTE — Progress Notes (Signed)
Prediabetes , avoid sugar , sweet , soda.  Other Labs are normal follow up with [facility] as planned

## 2021-11-21 ENCOUNTER — Encounter: Payer: Self-pay | Admitting: Family Medicine

## 2021-11-21 ENCOUNTER — Ambulatory Visit (INDEPENDENT_AMBULATORY_CARE_PROVIDER_SITE_OTHER): Payer: Medicare Other | Admitting: Family Medicine

## 2021-11-21 VITALS — BP 150/80 | HR 83 | Ht <= 58 in | Wt 183.1 lb

## 2021-11-21 DIAGNOSIS — I10 Essential (primary) hypertension: Secondary | ICD-10-CM | POA: Diagnosis not present

## 2021-11-21 DIAGNOSIS — Z23 Encounter for immunization: Secondary | ICD-10-CM | POA: Insufficient documentation

## 2021-11-21 DIAGNOSIS — Z0001 Encounter for general adult medical examination with abnormal findings: Secondary | ICD-10-CM | POA: Insufficient documentation

## 2021-11-21 DIAGNOSIS — N951 Menopausal and female climacteric states: Secondary | ICD-10-CM | POA: Diagnosis not present

## 2021-11-21 MED ORDER — OLMESARTAN-AMLODIPINE-HCTZ 40-10-12.5 MG PO TABS: 1.0000 | ORAL_TABLET | Freq: Every day | ORAL | 1 refills | Status: DC

## 2021-11-21 NOTE — Assessment & Plan Note (Signed)
Patient educated on CDC recommendation for the vaccine. Verbal consent was obtained from the patient, vaccine administered by nurse, no sign of adverse reactions noted at this time. Patient education on arm soreness and use of tylenol or ibuprofen for this patient  was discussed. Patient educated on the signs and symptoms of adverse effect and advise to contact the office if they occur.  

## 2021-11-21 NOTE — Patient Instructions (Addendum)
I appreciate the opportunity to provide care to you today!    Follow up:  1 months  Pleas pick up your new prescription the pharmacy and start therapy tomorrow    Please continue to a heart-healthy diet and increase your physical activities. Try to exercise for 14mns at least three times a week.      It was a pleasure to see you and I look forward to continuing to work together on your health and well-being. Please do not hesitate to call the office if you need care or have questions about your care.   Have a wonderful day and week. With Gratitude, GAlvira MondayMSN, FNP-BC

## 2021-11-21 NOTE — Assessment & Plan Note (Signed)
Uncontrolled Will discontinue osartan-hydrochlorothiazide 100-25 and amlodipine 2.5 mg daily Will start patient on olmesartan-amlodipine-hydrochlorothiazide 40-10-12.5 DASH diet reviewed Recommended less than 1500 daily sodium intake Encouraged to continue increase physical activities We will follow up in 4 weeks for blood pressure BP Readings from Last 3 Encounters:  11/21/21 (!) 150/80  10/23/21 (!) 105/59  07/20/21 137/82

## 2021-11-21 NOTE — Assessment & Plan Note (Signed)
She reports taking clonidine 0.1 mg daily at bedtime with minimal relief of her symptoms We will give patient 2 samples of veozah and inform her to call if she has relief symptom relief and needs a prescription

## 2021-11-21 NOTE — Assessment & Plan Note (Signed)
Physical exam as documented Counseling is done on healthy lifestyle involving commitment to 150 minutes of exercise per week,  Discussed heart-healthy diet and attaining a healthy weight Changes in health habits are decided on by the patient with goals and time frames set for achieving them. Immunization and cancer screening needs are specifically addressed at this visit  she received her flu and pneumonia vaccine today

## 2021-11-21 NOTE — Progress Notes (Signed)
Complete physical exam  Patient: Victoria Graves   DOB: 07/11/1955   66 y.o. Female  MRN: [id number]  Subjective:    Chief Complaint  Patient presents with   Annual Exam    Pt here for cpe would like to discuss hot flash medication she saw on tv, name of medication is veozah.    Victoria Graves is a 66 y.o. female who presents today for a complete physical exam. She reports consuming a general diet. Gym/ health club routine includes stair stepper , treadmill, and walking on track . She generally feels well. She reports sleeping well. She does have additional problems to discuss today.   Menopausal vasomotor symptoms: She complains of severe hot flashes, noting taking clonidine 0.1 mg at bedtime with minimal relief of her symptoms.  Hypertension: She takes losartan-hydrochlorothiazide 100-25 and amlodipine 2.5 mg daily.  She reports adherence to treatment regimen and denies headaches, dizziness, blurred vision, and palpitation.  She reports increased physical activities, noting that she has joined the gym and works out at least 3 days a week.  Most recent fall risk assessment:    11/21/2021    2:56 PM  [place] in the past year? 0  Number falls in past yr: 0  Injury with Fall? 0  Risk for fall due to : No Fall Risks  Follow up Falls evaluation completed     Most recent depression screenings:    11/21/2021    3:12 PM 11/21/2021    2:56 PM  PHQ 2/9 Scores  PHQ - 2 Score 0 0  PHQ- 9 Score 2     Dental: No current dental problems and No regular dental care   Patient Active Problem List   Diagnosis Date Noted   Menopausal vasomotor syndrome 11/21/2021   Encounter for general adult medical examination with abnormal findings 11/21/2021   Need for immunization against influenza 11/21/2021   CKD (chronic kidney disease) stage 3, GFR 30-59 ml/min ([place]) 05/08/2021   Morbid obesity ([place]) 03/09/2021   Osteopenia of neck of left femur 03/09/2021   Tubular adenoma of  colon 08/22/2016   Special screening for malignant neoplasms, colon    Vitamin D deficiency 01/31/2016   Tobacco abuse 11/01/2015   Depression 11/01/2015   HTN (hypertension) 11/01/2015   HLD (hyperlipidemia) 11/01/2015   Degenerative joint disease (DJD) of lumbar spine 11/01/2015   Osteoarthritis of left hip 11/01/2015   Past Medical History:  Diagnosis Date   Allergy    Anxiety    Arthritis    CKD (chronic kidney disease) stage 3, GFR 30-59 ml/min ([facility]) 05/08/2021   Depression    GERD (gastroesophageal reflux disease)    Hyperlipidemia    Hypertension    Past Surgical History:  Procedure Laterality Date   ABDOMINAL HYSTERECTOMY  1999   fibroid   COLONOSCOPY N/A 08/21/2016   Procedure: COLONOSCOPY;  Surgeon: Danie Binder, MD;  [facility];  Service: Endoscopy;  Laterality: N/A;  9:30 AM   COLONOSCOPY WITH PROPOFOL N/A 10/23/2021   Procedure: COLONOSCOPY WITH PROPOFOL;  Surgeon: Eloise Harman, DO;  Location: [facility];  Service: Endoscopy;  Laterality: N/A;  8:45 AM  ASA 2   POLYPECTOMY  08/21/2016   Procedure: POLYPECTOMY;  Surgeon: Danie Binder, MD;  Location: [facility];  Service: Endoscopy;;  ascending colon and sigmoid x2   POLYPECTOMY  10/23/2021   Procedure: POLYPECTOMY;  Surgeon: Eloise Harman, DO;  [facility];  Service: Endoscopy;;   Social History   Tobacco Use   Smoking status: Every Day    Packs/day: 0.10    Years: 20.00    Total pack years: 2.00    Types: Cigarettes   Smokeless tobacco: Never   Tobacco comments:    Smokes a pack a week. Smoking since age 66.   Vaping Use   Vaping Use: Never used  Substance Use Topics   Alcohol use: Yes    Comment: occasional   Drug use: No   Social History   Socioeconomic History   Marital status: Divorced    Spouse name: Not on file   Number of children: 0   Years of education: 10   Highest education level: Not on file  Occupational History   Occupation: disability     Comment: back and hip  Tobacco Use   Smoking status: Every Day    Packs/day: 0.10    Years: 20.00    Total pack years: 2.00    Types: Cigarettes   Smokeless tobacco: Never   Tobacco comments:    Smokes a pack a week. Smoking since age 44.   Vaping Use   Vaping Use: Never used  Substance and Sexual Activity   Alcohol use: Yes    Comment: occasional   Drug use: No   Sexual activity: Yes    Birth control/protection: Surgical    Comment: total hystetrectomy  Other Topics Concern   Not on file  Social History Narrative   Lives with friend and cousin who has mental disability   Social Determinants of Health   Financial Resource Strain: Low Risk  (10/15/2021)   Overall Financial Resource Strain (CARDIA)    Difficulty of Paying Living Expenses: Not hard at all  Food Insecurity: No Food Insecurity (10/15/2021)   Hunger Vital Sign    Worried About Running Out of Food in the Last Year: Never true    Ran Out of Food in the Last Year: Never true  Transportation Needs: No Transportation Needs (10/15/2021)   PRAPARE - Hydrologist (Medical): No    Lack of Transportation (Non-Medical): No  Physical Activity: Insufficiently Active (10/15/2021)   Exercise Vital Sign    Days of Exercise per Week: 3 days    Minutes of Exercise per Session: 30 min  Stress: No Stress Concern Present (10/15/2021)   [place]    Feeling of Stress : Not at all  Social Connections: Moderately Integrated (10/15/2021)   Social Connection and Isolation Panel [NHANES]    Frequency of Communication with Friends and Family: More than three times a week    Frequency of Social Gatherings with Friends and Family: More than three times a week    Attends Religious Services: More than 4 times per year    Active Member of [employer] or Organizations: Yes    Attends Archivist Meetings: More than 4 times per year    Marital  Status: Divorced  Intimate Partner Violence: Not At Risk (10/15/2021)   Humiliation, Afraid, Rape, and Kick questionnaire    Fear of Current or Ex-Partner: No    Emotionally Abused: No    Physically Abused: No    Sexually Abused: No   Family Status  Relation Name Status   Mother  Deceased   Father  Deceased   Brother  Deceased   MGM  Deceased   MGF  Deceased   PGM  Deceased   PGF  Deceased   Neg Hx  (Not Specified)   Family History  Problem Relation Age of Onset   Hypertension Mother    Arthritis Mother    Stomach cancer Mother    Heart disease Maternal Grandmother    Kidney disease Maternal Grandmother    Breast cancer Neg Hx    Cancer - Lung Neg Hx    Allergies  Allergen Reactions   Lisinopril Cough   Penicillins Rash    Has patient had a PCN reaction causing immediate rash, facial/tongue/throat swelling, SOB or lightheadedness with hypotension: Yes Has patient had a PCN reaction causing severe rash involving mucus membranes or skin necrosis: No Has patient had a PCN reaction that required hospitalization: No Has patient had a PCN reaction occurring within the last 10 years: No If all of the above answers are "NO", then may proceed with Cephalosporin use.       Patient Care Team: Alvira Monday, FNP as PCP - General (Family Medicine)   Outpatient Medications Prior to Visit  Medication Sig   acetaminophen (TYLENOL) 500 MG tablet Take 1,000 mg by mouth daily.   aspirin EC 81 MG tablet Take 81 mg by mouth daily.   Biotin w/ Vitamins C & E (HAIR/SKIN/NAILS PO) Take 2 tablets by mouth daily. 2.500 mcg   buPROPion (WELLBUTRIN XL) 150 MG 24 hr tablet TAKE 1 TABLET BY MOUTH DAILY   CALCIUM PO Take 600 mg by mouth in the morning.   Cholecalciferol (VITAMIN D) 2000 units CAPS Take 2,000 Units by mouth in the morning.   cloNIDine (CATAPRES) 0.1 MG tablet Take 0.1 mg by mouth at bedtime.   Cyanocobalamin (VITAMIN B 12 PO) Take 5,000 Units by mouth daily. Once a day    cycloSPORINE (RESTASIS) 0.05 % ophthalmic emulsion Place 2 drops into both eyes daily.   docusate sodium (COLACE) 100 MG capsule Take 100 mg by mouth daily.   FLUoxetine (PROZAC) 20 MG capsule Take 1 capsule (20 mg total) by mouth daily.   fluticasone (FLONASE) 50 MCG/ACT nasal spray Place 2 sprays into both nostrils daily as needed for allergies.   Metamucil Fiber CHEW Chew 15 mg by mouth daily. 3 gummies 5 mg each   Multiple Vitamins-Minerals (WOMENS MULTI GUMMIES PO) Take 2 tablets by mouth daily at 2 am. Vitafusion 2 gummies   naproxen (NAPROSYN) 500 MG tablet Take 500 mg by mouth daily as needed for mild pain or moderate pain.   pravastatin (PRAVACHOL) 40 MG tablet Take 1 tablet (40 mg total) by mouth daily.   [DISCONTINUED] amLODipine (NORVASC) 2.5 MG tablet Take 1 tablet by mouth once daily   [DISCONTINUED] losartan-hydrochlorothiazide (HYZAAR) 100-25 MG tablet TAKE 1 TABLET BY MOUTH DAILY   No facility-administered medications prior to visit.    Review of Systems  Constitutional:  Negative for diaphoresis and malaise/fatigue.  HENT:  Negative for congestion and sore throat.   Eyes:  Negative for redness.  Respiratory:  Negative for cough and shortness of breath.   Cardiovascular:  Negative for chest pain and palpitations.  Gastrointestinal:  Negative for abdominal pain and constipation.  Genitourinary:  Negative for frequency and urgency.  Musculoskeletal:  Negative for falls and neck pain.  Skin:  Negative for itching.  Neurological:  Negative for tremors and weakness.  Endo/Heme/Allergies:  Negative for polydipsia.  Psychiatric/Behavioral:  Negative for memory loss. The patient is not nervous/anxious and does not have insomnia.           Objective:  BP (!) 150/80 (BP Location: Left Arm)   Pulse 83   Ht 4' 10" (1.473 m)   Wt 183 lb 1.3 oz (83 kg)   SpO2 98%   BMI 38.26 kg/m  BP Readings from Last 3 Encounters:  11/21/21 (!) 150/80  10/23/21 (!) 105/59   07/20/21 137/82   Wt Readings from Last 3 Encounters:  11/21/21 183 lb 1.3 oz (83 kg)  10/23/21 180 lb (81.6 kg)  10/18/21 185 lb (83.9 kg)      Physical Exam HENT:     Head: Normocephalic.     Right Ear: External ear normal.     Left Ear: External ear normal.     Nose: No congestion.     Mouth/Throat:     Mouth: Mucous membranes are moist.  Eyes:     Extraocular Movements: Extraocular movements intact.     Pupils: Pupils are equal, round, and reactive to light.  Cardiovascular:     Rate and Rhythm: Normal rate and regular rhythm.     Pulses: Normal pulses.     Heart sounds: Normal heart sounds.  Pulmonary:     Effort: Pulmonary effort is normal.     Breath sounds: Normal breath sounds.  Abdominal:     Palpations: Abdomen is soft.     Tenderness: There is no right CVA tenderness or left CVA tenderness.  Musculoskeletal:     Cervical back: No rigidity.     Right lower leg: No edema.     Left lower leg: No edema.  Skin:    Findings: No lesion or rash.  Neurological:     Mental Status: She is alert and oriented to person, place, and time.     Motor: No tremor.     Coordination: Finger-Nose-Finger Test normal.     Gait: Gait normal.  Psychiatric:     Comments: Normal affect      No results found for any visits on 11/21/21. Last CBC Lab Results  Component Value Date   WBC 4.7 05/01/2021   HGB 13.3 05/01/2021   HCT 40.7 05/01/2021   MCV 86 05/01/2021   MCH 27.9 05/01/2021   RDW 12.8 05/01/2021   PLT 258 26/71/2458   Last metabolic panel Lab Results  Component Value Date   GLUCOSE 109 (H) 11/13/2021   NA 143 11/13/2021   K 3.7 11/13/2021   CL 103 11/13/2021   CO2 26 11/13/2021   BUN 12 11/13/2021   CREATININE 0.92 11/13/2021   EGFR 69 11/13/2021   CALCIUM 9.6 11/13/2021   PROT 6.1 11/13/2021   ALBUMIN 4.0 11/13/2021   LABGLOB 2.1 11/13/2021   AGRATIO 1.9 11/13/2021   BILITOT 0.2 11/13/2021   ALKPHOS 60 11/13/2021   AST 20 11/13/2021   ALT 17  11/13/2021   ANIONGAP 7 10/15/2021   Last lipids Lab Results  Component Value Date   CHOL 153 11/13/2021   HDL 69 11/13/2021   LDLCALC 72 11/13/2021   TRIG 55 11/13/2021   CHOLHDL 2.2 11/13/2021   Last hemoglobin A1c Lab Results  Component Value Date   HGBA1C 6.0 (H) 11/13/2021   Last thyroid functions Lab Results  Component Value Date   TSH 4.320 11/13/2021   Last vitamin D Lab Results  Component Value Date   VD25OH 39.0 05/01/2021   Last vitamin B12 and Folate No results found for: "VITAMINB12", "FOLATE"      Assessment & Plan:    Routine Health Maintenance and Physical Exam  Immunization History  Administered  Date(s) Administered   Fluad Quad(high Dose 65+) 11/21/2021   Influenza,inj,Quad PF,6+ Mos 11/29/2015, 02/06/2017   Influenza-Unspecified 11/28/2020   PFIZER Comirnaty(Gray Top)Covid-19 Tri-Sucrose Vaccine 03/02/2020   PFIZER(Purple Top)SARS-COV-2 Vaccination 03/23/2020   PNEUMOCOCCAL CONJUGATE-20 11/21/2021   Tdap 11/29/2015    Health Maintenance  Topic Date Due   Zoster Vaccines- Shingrix (1 of 2) Never done   Medicare Annual Wellness (AWV)  11/15/2022   MAMMOGRAM  06/16/2023   TETANUS/TDAP  11/28/2025   COLONOSCOPY (Pts 45-65yr Insurance coverage will need to be confirmed)  10/24/2031   Pneumonia Vaccine 65 Years old  Completed   INFLUENZA VACCINE  Completed   DEXA SCAN  Completed   Hepatitis C Screening  Completed   HIV Screening  Completed   HPV VACCINES  Aged Out   PAP SMEAR-Modifier  Discontinued   COVID-19 Vaccine  Discontinued    Discussed health benefits of physical activity, and encouraged her to engage in regular exercise appropriate for her age and condition.  Problem List Items Addressed This Visit       Cardiovascular and Mediastinum   HTN (hypertension)    Uncontrolled Will discontinue osartan-hydrochlorothiazide 100-25 and amlodipine 2.5 mg daily Will start patient on olmesartan-amlodipine-hydrochlorothiazide  40-10-12.5 DASH diet reviewed Recommended less than 1500 daily sodium intake Encouraged to continue increase physical activities We will follow up in 4 weeks for blood pressure BP Readings from Last 3 Encounters:  11/21/21 (!) 150/80  10/23/21 (!) 105/59  07/20/21 137/82        Relevant Medications   Olmesartan-amLODIPine-HCTZ 40-10-12.5 MG TABS     Other   Menopausal vasomotor syndrome    She reports taking clonidine 0.1 mg daily at bedtime with minimal relief of her symptoms We will give patient 2 samples of veozah and inform her to call if she has relief symptom relief and needs a prescription       Encounter for general adult medical examination with abnormal findings - Primary    Physical exam as documented Counseling is done on healthy lifestyle involving commitment to 150 minutes of exercise per week,  Discussed heart-healthy diet and attaining a healthy weight Changes in health habits are decided on by the patient with goals and time frames set for achieving them. Immunization and cancer screening needs are specifically addressed at this visit  she received her flu and pneumonia vaccine today          Need for immunization against influenza    Patient educated on CDC recommendation for the vaccine. Verbal consent was obtained from the patient, vaccine administered by nurse, no sign of adverse reactions noted at this time. Patient education on arm soreness and use of tylenol or ibuprofen for this patient  was discussed. Patient educated on the signs and symptoms of adverse effect and advise to contact the office if they occur.      Other Visit Diagnoses     Flu vaccine need       Relevant Orders   Flu Vaccine QUAD High Dose(Fluad) (Completed)   Immunization due       Relevant Orders   Pneumococcal conjugate vaccine 20-valent (Completed)      Return in about 1 month (around 12/22/2021) for BP.     GAlvira Monday FNP

## 2021-12-06 ENCOUNTER — Telehealth: Payer: Self-pay | Admitting: Family Medicine

## 2021-12-06 NOTE — Telephone Encounter (Signed)
Patient called needs to know will [facility] send in the generic brand the new medicine but patient does not know the name of she thinks this is it  Administrator, sports. Needs the generic brand that starts with the F and its 45 mg.  Pharmacy: [facility]

## 2021-12-07 ENCOUNTER — Other Ambulatory Visit: Payer: Self-pay | Admitting: Family Medicine

## 2021-12-07 NOTE — Telephone Encounter (Signed)
Victoria Graves is sold under the name Victoria Graves. Kindly inform the patient that it is the same medication

## 2021-12-07 NOTE — Telephone Encounter (Signed)
Spoke to pt sample of veozah was given at last appt, she has checked with her insurance , they will not cover this however they can cover the generic fezolinetant, she would like to have a prescription sent in to her pharmacy?

## 2021-12-10 ENCOUNTER — Other Ambulatory Visit: Payer: Self-pay | Admitting: Family Medicine

## 2021-12-10 DIAGNOSIS — N951 Menopausal and female climacteric states: Secondary | ICD-10-CM

## 2021-12-10 MED ORDER — CITALOPRAM HYDROBROMIDE 20 MG PO TABS: 20.0000 mg | ORAL_TABLET | Freq: Every day | ORAL | 3 refills | Status: DC

## 2021-12-10 NOTE — Telephone Encounter (Signed)
Pt informed, states she is not taking prozac.

## 2021-12-10 NOTE — Telephone Encounter (Signed)
A prescription for Celexa 20 mg daily has been sent to the pharmacy for vasomotor symptoms.  I recommend that she taper off Prozac 20 mg daily for 2 weeks before starting Celexa 20 mg daily.

## 2021-12-24 ENCOUNTER — Encounter: Payer: Self-pay | Admitting: Family Medicine

## 2021-12-24 ENCOUNTER — Ambulatory Visit (INDEPENDENT_AMBULATORY_CARE_PROVIDER_SITE_OTHER): Payer: Medicare Other | Admitting: Family Medicine

## 2021-12-24 VITALS — BP 127/70 | HR 85 | Ht <= 58 in | Wt 183.1 lb

## 2021-12-24 DIAGNOSIS — G8929 Other chronic pain: Secondary | ICD-10-CM | POA: Diagnosis not present

## 2021-12-24 DIAGNOSIS — I10 Essential (primary) hypertension: Secondary | ICD-10-CM | POA: Diagnosis not present

## 2021-12-24 DIAGNOSIS — M25532 Pain in left wrist: Secondary | ICD-10-CM | POA: Diagnosis not present

## 2021-12-24 MED ORDER — OLMESARTAN-AMLODIPINE-HCTZ 40-10-12.5 MG PO TABS: 1.0000 | ORAL_TABLET | Freq: Every day | ORAL | 1 refills | Status: DC

## 2021-12-24 NOTE — Patient Instructions (Signed)
I appreciate the opportunity to provide care to you today!    Follow up:  3 months  Labs: Next appt   Please pick your medications at the pharmacy  Please continue to a heart-healthy diet and increase your physical activities. Try to exercise for 69mns at least three times a week.      It was a pleasure to see you and I look forward to continuing to work together on your health and well-being. Please do not hesitate to call the office if you need care or have questions about your care.   Have a wonderful day and week. With Gratitude, GAlvira MondayMSN, FNP-BC

## 2021-12-24 NOTE — Assessment & Plan Note (Signed)
Encouraged to take over-the-counter B complex for symptomatic management of left wrist pain Encouraged to follow up for worsening of her symptoms

## 2021-12-24 NOTE — Assessment & Plan Note (Signed)
Controlled To continue taking olmesartan-amlodipine-hydrochlorothiazide 40- 10-12.5 mg daily No changes to medication regimen today BP Readings from Last 3 Encounters:  12/24/21 127/70  11/21/21 (!) 150/80  10/23/21 (!) 105/59

## 2021-12-24 NOTE — Progress Notes (Signed)
Established Patient Office Visit  Subjective:  Patient ID: Victoria Graves, female    DOB: [birth year]  Age: 66 y.o. MRN: [id number]  CC:  Chief Complaint  Patient presents with   Follow-up    1 month f/u, pt reports doing well    HPI Victoria Graves is a 66 y.o. female with past medical history of essential hypertension and depression presents for f/u of  chronic medical conditions.  Essential hypertension: She takes olmesartan-amlodipine-hydrochlorothiazide 40- 10-12.5 mg daily.  She reports compliance with treatment regimen.  She denies headaches, dizziness, blurred vision, chest pain, palpitation.  Left wrist pain: She complains of left wrist pain with numbness and tingling.  She reports chronic condition that has been relieved with vitamin B12 supplement.  She denies any recent injury to the left ribs and repetitive movements.  Past Medical History:  Diagnosis Date   Allergy    Anxiety    Arthritis    CKD (chronic kidney disease) stage 3, GFR 30-59 ml/min (HCC) 05/08/2021   Depression    GERD (gastroesophageal reflux disease)    Hyperlipidemia    Hypertension     Past Surgical History:  Procedure Laterality Date   ABDOMINAL HYSTERECTOMY  1999   fibroid   COLONOSCOPY N/A 08/21/2016   Procedure: COLONOSCOPY;  Surgeon: Danie Binder, MD;  [facility]: [facility];  Service: Endoscopy;  Laterality: N/A;  9:30 AM   COLONOSCOPY WITH PROPOFOL N/A 10/23/2021   Procedure: COLONOSCOPY WITH PROPOFOL;  Surgeon: Eloise Harman, DO;  Location: [facility];  Service: Endoscopy;  Laterality: N/A;  8:45 AM  ASA 2   POLYPECTOMY  08/21/2016   Procedure: POLYPECTOMY;  Surgeon: Danie Binder, MD;  Location: [facility];  Service: Endoscopy;;  ascending colon and sigmoid x2   POLYPECTOMY  10/23/2021   Procedure: POLYPECTOMY;  Surgeon: Eloise Harman, DO;  [facility]: [facility];  Service: Endoscopy;;    Family History  Problem Relation Age of Onset   Hypertension Mother     Arthritis Mother    Stomach cancer Mother    Heart disease Maternal Grandmother    Kidney disease Maternal Grandmother    Breast cancer Neg Hx    Cancer - Lung Neg Hx     Social History   Socioeconomic History   Marital status: Divorced    Spouse name: Not on file   Number of children: 0   Years of education: 10   Highest education level: Not on file  Occupational History   Occupation: disability    Comment: back and hip  Tobacco Use   Smoking status: Every Day    Packs/day: 0.10    Years: 20.00    Total pack years: 2.00    Types: Cigarettes   Smokeless tobacco: Never   Tobacco comments:    Smokes a pack a week. Smoking since age 44.   Vaping Use   Vaping Use: Never used  Substance and Sexual Activity   Alcohol use: Yes    Comment: occasional   Drug use: No   Sexual activity: Yes    Birth control/protection: Surgical    Comment: total hystetrectomy  Other Topics Concern   Not on file  Social History Narrative   Lives with friend and cousin who has mental disability   Social Determinants of Health   Financial Resource Strain: Low Risk  (10/15/2021)   Overall Financial Resource Strain (CARDIA)    Difficulty of Paying Living Expenses: Not hard at all  Food Insecurity: No Food Insecurity (10/15/2021)   Hunger Vital Sign    Worried About Running Out of Food in the Last Year: Never true    Ran Out of Food in the Last Year: Never true  Transportation Needs: No Transportation Needs (10/15/2021)   PRAPARE - Hydrologist (Medical): No    Lack of Transportation (Non-Medical): No  Physical Activity: Insufficiently Active (10/15/2021)   Exercise Vital Sign    Days of Exercise per Week: 3 days    Minutes of Exercise per Session: 30 min  Stress: No Stress Concern Present (10/15/2021)   [place]    Feeling of Stress : Not at all  Social Connections: Moderately Integrated  (10/15/2021)   Social Connection and Isolation Panel [NHANES]    Frequency of Communication with Friends and Family: More than three times a week    Frequency of Social Gatherings with Friends and Family: More than three times a week    Attends Religious Services: More than 4 times per year    Active Member of [employer] or Organizations: Yes    Attends Music therapist: More than 4 times per year    Marital Status: Divorced  Intimate Partner Violence: Not At Risk (10/15/2021)   Humiliation, Afraid, Rape, and Kick questionnaire    Fear of Current or Ex-Partner: No    Emotionally Abused: No    Physically Abused: No    Sexually Abused: No    Outpatient Medications Prior to Visit  Medication Sig Dispense Refill   acetaminophen (TYLENOL) 500 MG tablet Take 1,000 mg by mouth daily.     aspirin EC 81 MG tablet Take 81 mg by mouth daily.     Biotin w/ Vitamins C & E (HAIR/SKIN/NAILS PO) Take 2 tablets by mouth daily. 2.500 mcg     buPROPion (WELLBUTRIN XL) 150 MG 24 hr tablet TAKE 1 TABLET BY MOUTH DAILY 90 tablet 3   CALCIUM PO Take 600 mg by mouth in the morning.     Cholecalciferol (VITAMIN D) 2000 units CAPS Take 2,000 Units by mouth in the morning.     citalopram (CELEXA) 20 MG tablet Take 1 tablet (20 mg total) by mouth daily. 30 tablet 3   Cyanocobalamin (VITAMIN B 12 PO) Take 5,000 Units by mouth daily. Once a day     cycloSPORINE (RESTASIS) 0.05 % ophthalmic emulsion Place 2 drops into both eyes daily.     docusate sodium (COLACE) 100 MG capsule Take 100 mg by mouth daily.     FLUoxetine (PROZAC) 20 MG capsule Take 20 mg by mouth daily.     fluticasone (FLONASE) 50 MCG/ACT nasal spray Place 2 sprays into both nostrils daily as needed for allergies.     Metamucil Fiber CHEW Chew 15 mg by mouth daily. 3 gummies 5 mg each     Multiple Vitamins-Minerals (WOMENS MULTI GUMMIES PO) Take 2 tablets by mouth daily at 2 am. Vitafusion 2 gummies     naproxen (NAPROSYN) 500 MG tablet Take  500 mg by mouth daily as needed for mild pain or moderate pain.     pravastatin (PRAVACHOL) 40 MG tablet Take 1 tablet (40 mg total) by mouth daily. 90 tablet 3   Olmesartan-amLODIPine-HCTZ 40-10-12.5 MG TABS Take 1 tablet by mouth daily. 30 tablet 1   cloNIDine (CATAPRES) 0.1 MG tablet Take 0.1 mg by mouth at bedtime. (Patient not taking: Reported on 12/24/2021)  No facility-administered medications prior to visit.    Allergies  Allergen Reactions   Lisinopril Cough   Penicillins Rash    Has patient had a PCN reaction causing immediate rash, facial/tongue/throat swelling, SOB or lightheadedness with hypotension: Yes Has patient had a PCN reaction causing severe rash involving mucus membranes or skin necrosis: No Has patient had a PCN reaction that required hospitalization: No Has patient had a PCN reaction occurring within the last 10 years: No If all of the above answers are "NO", then may proceed with Cephalosporin use.     ROS Review of Systems  Constitutional:  Negative for fatigue and fever.  Eyes:  Negative for visual disturbance.  Respiratory:  Negative for chest tightness and shortness of breath.   Cardiovascular:  Negative for chest pain and palpitations.  Musculoskeletal:        Left wrist pain  Neurological:  Negative for dizziness and facial asymmetry.      Objective:    Physical Exam HENT:     Head: Normocephalic.     Right Ear: External ear normal.     Left Ear: External ear normal.  Cardiovascular:     Rate and Rhythm: Normal rate and regular rhythm.     Pulses: Normal pulses.     Heart sounds: Normal heart sounds.  Pulmonary:     Breath sounds: Normal breath sounds.  Musculoskeletal:     Left wrist: No tenderness or crepitus. Normal range of motion.     Comments: Mild tenderness noted at the medial nerve with the phalen test.  She denies burning tingling numbness in the distribution of the median nerve with the phalen test.  Negative Tinel's sign   Neurological:     Mental Status: She is alert.     BP 127/70   Pulse 85   Ht _0  (1.473 m)   Wt 183 lb 1.3 oz (83 kg)   SpO2 96%   BMI 38.26 kg/m  Wt Readings from Last 3 Encounters:  12/24/21 183 lb 1.3 oz (83 kg)  11/21/21 183 lb 1.3 oz (83 kg)  10/23/21 180 lb (81.6 kg)    Lab Results  Component Value Date   TSH 4.320 11/13/2021   Lab Results  Component Value Date   WBC 4.7 05/01/2021   HGB 13.3 05/01/2021   HCT 40.7 05/01/2021   MCV 86 05/01/2021   PLT 258 05/01/2021   Lab Results  Component Value Date   NA 143 11/13/2021   K 3.7 11/13/2021   CO2 26 11/13/2021   GLUCOSE 109 (H) 11/13/2021   BUN 12 11/13/2021   CREATININE 0.92 11/13/2021   BILITOT 0.2 11/13/2021   ALKPHOS 60 11/13/2021   AST 20 11/13/2021   ALT 17 11/13/2021   PROT 6.1 11/13/2021   ALBUMIN 4.0 11/13/2021   CALCIUM 9.6 11/13/2021   ANIONGAP 7 10/15/2021   EGFR 69 11/13/2021   Lab Results  Component Value Date   CHOL 153 11/13/2021   Lab Results  Component Value Date   HDL 69 11/13/2021   Lab Results  Component Value Date   LDLCALC 72 11/13/2021   Lab Results  Component Value Date   TRIG 55 11/13/2021   Lab Results  Component Value Date   CHOLHDL 2.2 11/13/2021   Lab Results  Component Value Date   HGBA1C 6.0 (H) 11/13/2021      Assessment & Plan:  Primary hypertension Assessment & Plan: Controlled To continue taking olmesartan-amlodipine-hydrochlorothiazide 40- 10-12.5 mg daily No changes  to medication regimen today BP Readings from Last 3 Encounters:  12/24/21 127/70  11/21/21 (!) 150/80  10/23/21 (!) 105/59     Orders: -     Olmesartan-amLODIPine-HCTZ; Take 1 tablet by mouth daily.  Dispense: 90 tablet; Refill: 1  Chronic pain of left wrist Assessment & Plan: Encouraged to take over-the-counter B complex for symptomatic management of left wrist pain Encouraged to follow up for worsening of her symptoms     Follow-up: Return in about 3 months  (around 03/26/2022).   Alvira Monday, FNP

## 2022-01-23 ENCOUNTER — Other Ambulatory Visit: Payer: Self-pay

## 2022-03-27 ENCOUNTER — Ambulatory Visit (INDEPENDENT_AMBULATORY_CARE_PROVIDER_SITE_OTHER): Payer: 59 | Admitting: Family Medicine

## 2022-03-27 ENCOUNTER — Encounter: Payer: Self-pay | Admitting: Family Medicine

## 2022-03-27 VITALS — BP 125/67 | HR 83 | Ht <= 58 in | Wt 183.0 lb

## 2022-03-27 DIAGNOSIS — E559 Vitamin D deficiency, unspecified: Secondary | ICD-10-CM

## 2022-03-27 DIAGNOSIS — R7301 Impaired fasting glucose: Secondary | ICD-10-CM | POA: Diagnosis not present

## 2022-03-27 DIAGNOSIS — N951 Menopausal and female climacteric states: Secondary | ICD-10-CM | POA: Diagnosis not present

## 2022-03-27 DIAGNOSIS — F3341 Major depressive disorder, recurrent, in partial remission: Secondary | ICD-10-CM | POA: Diagnosis not present

## 2022-03-27 DIAGNOSIS — I10 Essential (primary) hypertension: Secondary | ICD-10-CM | POA: Diagnosis not present

## 2022-03-27 DIAGNOSIS — E7849 Other hyperlipidemia: Secondary | ICD-10-CM

## 2022-03-27 DIAGNOSIS — M1612 Unilateral primary osteoarthritis, left hip: Secondary | ICD-10-CM

## 2022-03-27 MED ORDER — OLMESARTAN-AMLODIPINE-HCTZ 40-10-12.5 MG PO TABS: 1.0000 | ORAL_TABLET | Freq: Every day | ORAL | 1 refills | Status: DC

## 2022-03-27 MED ORDER — CITALOPRAM HYDROBROMIDE 20 MG PO TABS: 20.0000 mg | ORAL_TABLET | Freq: Every day | ORAL | 1 refills | Status: DC

## 2022-03-27 NOTE — Progress Notes (Addendum)
Established Patient Office Visit  Subjective:  Patient ID: Victoria Graves, female    DOB: July 12, 1955  Age: 67 y.o. MRN: [id number]  CC:  Chief Complaint  Patient presents with   Follow-up    3 month f/u, pt reports continuous hip and back pain  pain level is a 2.     HPI Victoria Graves is a 67 y.o. female with past medical history of hypertension, degenerative joint disease of lumbar spine, osteoarthritis of left hip, tobacco abuse, depression, and  vitamin D deficiency presents for f/u of  chronic medical conditions. For the details of today's visit, please refer to the assessment and plan.     Past Medical History:  Diagnosis Date   Allergy    Anxiety    Arthritis    CKD (chronic kidney disease) stage 3, GFR 30-59 ml/min (HCC) 05/08/2021   Depression    GERD (gastroesophageal reflux disease)    Hyperlipidemia    Hypertension     Past Surgical History:  Procedure Laterality Date   ABDOMINAL HYSTERECTOMY  1999   fibroid   COLONOSCOPY N/A 08/21/2016   Procedure: COLONOSCOPY;  Surgeon: West Bali, MD;  [facility]: [facility];  Service: Endoscopy;  Laterality: N/A;  9:30 AM   COLONOSCOPY WITH PROPOFOL N/A 10/23/2021   Procedure: COLONOSCOPY WITH PROPOFOL;  Surgeon: Lanelle Bal, DO;  [facility];  Service: Endoscopy;  Laterality: N/A;  8:45 AM  ASA 2   POLYPECTOMY  08/21/2016   Procedure: POLYPECTOMY;  Surgeon: West Bali, MD;  [facility];  Service: Endoscopy;;  ascending colon and sigmoid x2   POLYPECTOMY  10/23/2021   Procedure: POLYPECTOMY;  Surgeon: Lanelle Bal, DO;  [facility];  Service: Endoscopy;;    Family History  Problem Relation Age of Onset   Hypertension Mother    Arthritis Mother    Stomach cancer Mother    Heart disease Maternal Grandmother    Kidney disease Maternal Grandmother    Breast cancer Neg Hx    Cancer - Lung Neg Hx     Social History   Socioeconomic History   Marital status:  Divorced    Spouse name: Not on file   Number of children: 0   Years of education: 10   Highest education level: Not on file  Occupational History   Occupation: disability    Comment: back and hip  Tobacco Use   Smoking status: Every Day    Packs/day: 0.10    Years: 20.00    Additional pack years: 0.00    Total pack years: 2.00    Types: Cigarettes   Smokeless tobacco: Never   Tobacco comments:    Smokes a pack a week. Smoking since age 50.   Vaping Use   Vaping Use: Never used  Substance and Sexual Activity   Alcohol use: Yes    Comment: occasional   Drug use: No   Sexual activity: Yes    Birth control/protection: Surgical    Comment: total hystetrectomy  Other Topics Concern   Not on file  Social History Narrative   Lives with friend and cousin who has mental disability   Social Determinants of Health   Financial Resource Strain: Low Risk  (10/15/2021)   Overall Financial Resource Strain (CARDIA)    Difficulty of Paying Living Expenses: Not hard at all  Food Insecurity: No Food Insecurity (10/15/2021)   Hunger Vital Sign    Worried About Running Out  of Food in the Last Year: Never true    Ran Out of Food in the Last Year: Never true  Transportation Needs: No Transportation Needs (10/15/2021)   PRAPARE - Administrator, Civil Service (Medical): No    Lack of Transportation (Non-Medical): No  Physical Activity: Insufficiently Active (10/15/2021)   Exercise Vital Sign    Days of Exercise per Week: 3 days    Minutes of Exercise per Session: 30 min  Stress: No Stress Concern Present (10/15/2021)   [employer] of Occupational Health - Occupational Stress Questionnaire    Feeling of Stress : Not at all  Social Connections: Moderately Integrated (10/15/2021)   Social Connection and Isolation Panel [NHANES]    Frequency of Communication with Friends and Family: More than three times a week    Frequency of Social Gatherings with Friends and Family: More than  three times a week    Attends Religious Services: More than 4 times per year    Active Member of [employer] or Organizations: Yes    Attends Engineer, structural: More than 4 times per year    Marital Status: Divorced  Intimate Partner Violence: Not At Risk (10/15/2021)   Humiliation, Afraid, Rape, and Kick questionnaire    Fear of Current or Ex-Partner: No    Emotionally Abused: No    Physically Abused: No    Sexually Abused: No    Outpatient Medications Prior to Visit  Medication Sig Dispense Refill   acetaminophen (TYLENOL) 500 MG tablet Take 1,000 mg by mouth daily.     aspirin EC 81 MG tablet Take 81 mg by mouth daily.     Biotin w/ Vitamins C & E (HAIR/SKIN/NAILS PO) Take 2 tablets by mouth daily. 2.500 mcg     buPROPion (WELLBUTRIN XL) 150 MG 24 hr tablet TAKE 1 TABLET BY MOUTH DAILY 90 tablet 3   CALCIUM PO Take 600 mg by mouth in the morning.     Cholecalciferol (VITAMIN D) 2000 units CAPS Take 2,000 Units by mouth in the morning.     Cyanocobalamin (VITAMIN B 12 PO) Take 5,000 Units by mouth daily. Once a day     cycloSPORINE (RESTASIS) 0.05 % ophthalmic emulsion Place 2 drops into both eyes daily.     docusate sodium (COLACE) 100 MG capsule Take 100 mg by mouth daily.     fluticasone (FLONASE) 50 MCG/ACT nasal spray Place 2 sprays into both nostrils daily as needed for allergies.     Metamucil Fiber CHEW Chew 15 mg by mouth daily. 3 gummies 5 mg each     Multiple Vitamins-Minerals (WOMENS MULTI GUMMIES PO) Take 2 tablets by mouth daily at 2 am. Vitafusion 2 gummies     naproxen (NAPROSYN) 500 MG tablet Take 500 mg by mouth daily as needed for mild pain or moderate pain.     pravastatin (PRAVACHOL) 40 MG tablet Take 1 tablet (40 mg total) by mouth daily. 90 tablet 3   citalopram (CELEXA) 20 MG tablet Take 1 tablet (20 mg total) by mouth daily. 30 tablet 3   Olmesartan-amLODIPine-HCTZ 40-10-12.5 MG TABS Take 1 tablet by mouth daily. 90 tablet 1   FLUoxetine (PROZAC) 20 MG  capsule Take 20 mg by mouth daily.     No facility-administered medications prior to visit.    Allergies  Allergen Reactions   Lisinopril Cough   Penicillins Rash    Has patient had a PCN reaction causing immediate rash, facial/tongue/throat swelling, SOB or lightheadedness with  hypotension: Yes Has patient had a PCN reaction causing severe rash involving mucus membranes or skin necrosis: No Has patient had a PCN reaction that required hospitalization: No Has patient had a PCN reaction occurring within the last 10 years: No If all of the above answers are "NO", then may proceed with Cephalosporin use.     ROS Review of Systems  Constitutional:  Negative for chills and fever.  Eyes:  Negative for visual disturbance.  Respiratory:  Negative for chest tightness and shortness of breath.   Musculoskeletal:  Positive for arthralgias.  Neurological:  Negative for dizziness and headaches.      Objective:    Physical Exam HENT:     Head: Normocephalic.     Mouth/Throat:     Mouth: Mucous membranes are moist.  Cardiovascular:     Rate and Rhythm: Normal rate.     Heart sounds: Normal heart sounds.  Pulmonary:     Effort: Pulmonary effort is normal.     Breath sounds: Normal breath sounds.  Musculoskeletal:     Comments: Mild tenderness with palpation of the left hip and left knee Range of motion is intact No visible deformity seen   Neurological:     Mental Status: She is alert.     BP 125/67   Pulse 83   Ht 4\' 10"  (1.473 m)   Wt 183 lb 0.6 oz (83 kg)   SpO2 95%   BMI 38.26 kg/m  Wt Readings from Last 3 Encounters:  03/27/22 183 lb 0.6 oz (83 kg)  12/24/21 183 lb 1.3 oz (83 kg)  11/21/21 183 lb 1.3 oz (83 kg)    Lab Results  Component Value Date   TSH 4.320 11/13/2021   Lab Results  Component Value Date   WBC 4.7 05/01/2021   HGB 13.3 05/01/2021   HCT 40.7 05/01/2021   MCV 86 05/01/2021   PLT 258 05/01/2021   Lab Results  Component Value Date   NA  143 11/13/2021   K 3.7 11/13/2021   CO2 26 11/13/2021   GLUCOSE 109 (H) 11/13/2021   BUN 12 11/13/2021   CREATININE 0.92 11/13/2021   BILITOT 0.2 11/13/2021   ALKPHOS 60 11/13/2021   AST 20 11/13/2021   ALT 17 11/13/2021   PROT 6.1 11/13/2021   ALBUMIN 4.0 11/13/2021   CALCIUM 9.6 11/13/2021   ANIONGAP 7 10/15/2021   EGFR 69 11/13/2021   Lab Results  Component Value Date   CHOL 153 11/13/2021   Lab Results  Component Value Date   HDL 69 11/13/2021   Lab Results  Component Value Date   LDLCALC 72 11/13/2021   Lab Results  Component Value Date   TRIG 55 11/13/2021   Lab Results  Component Value Date   CHOLHDL 2.2 11/13/2021   Lab Results  Component Value Date   HGBA1C 6.0 (H) 11/13/2021      Assessment & Plan:  Primary hypertension Assessment & Plan: Controlled Encouraged to continue taking olmesartan-amlodipine-hydrochlorothiazide 40- 10-12.5 mg daily No changes to medication regimen today BP Readings from Last 3 Encounters:  03/27/22 125/67  12/24/21 127/70  11/21/21 (!) 150/80     Orders: -     CBC with Differential/Platelet -     CMP14+EGFR -     Olmesartan-amLODIPine-HCTZ; Take 1 tablet by mouth daily.  Dispense: 90 tablet; Refill: 1  Menopausal vasomotor syndrome Assessment & Plan: Stable on Celexa 20 mg daily   Orders: -     Citalopram Hydrobromide; Take 1  tablet (20 mg total) by mouth daily.  Dispense: 90 tablet; Refill: 1  Other hyperlipidemia Assessment & Plan: She takes pravastatin 40 mg daily Denies abdominal pain, nausea and vomiting No muscle aches and pain reported Lab Results  Component Value Date   CHOL 153 11/13/2021   HDL 69 11/13/2021   LDLCALC 72 11/13/2021   TRIG 55 11/13/2021   CHOLHDL 2.2 11/13/2021     Orders: -     Lipid panel  Recurrent major depressive disorder, in partial remission (HCC) Assessment & Plan: Denies suicidal thoughts and ideation Well-controlled on Wellbutrin and Prozac daily Encouraged to  continue on treatment regimen   Primary osteoarthritis of left hip Assessment & Plan: Chronic condition No recent injury or trauma to the affected sites She follows up orthopedics for chronic left hip pain and lumbar pain She rates pain 2 out of 10 She reports taking naproxen 500 mg as needed for pain relief Encouraged to continue treatment regimen Encouraged heat application to the affected site as needed   Impaired fasting blood sugar -     Hemoglobin A1c  Vitamin D deficiency -     VITAMIN D 25 Hydroxy (Vit-D Deficiency, Fractures)  Morbid obesity (HCC) Assessment & Plan: Complains of knee pain No systematic symptoms reported No inflammatory symptoms reported Knee pain likely due to arthritis and the patient's pain The patient will benefit from knee orthosis     Follow-up: Return in about 3 months (around 06/25/2022).   Gilmore Laroche, FNP

## 2022-03-27 NOTE — Assessment & Plan Note (Signed)
Controlled Encouraged to continue taking olmesartan-amlodipine-hydrochlorothiazide 40- 10-12.5 mg daily No changes to medication regimen today BP Readings from Last 3 Encounters:  03/27/22 125/67  12/24/21 127/70  11/21/21 (!) 150/80

## 2022-03-27 NOTE — Assessment & Plan Note (Signed)
Stable on Celexa 20 mg daily

## 2022-03-27 NOTE — Patient Instructions (Signed)
I appreciate the opportunity to provide care to you today!    Follow up:  3 months  Labs: please stop by the lab today to get your blood drawn (CBC, CMP, TSH, Lipid profile, HgA1c, Vit D)  Please pick up your medications at the pharmacy   Please continue to a heart-healthy diet and increase your physical activities. Try to exercise for 13mns at least five times a week.   Physical activity helps: Lower your blood glucose, improve your heart health, lower your blood pressure and cholesterol, burn calories to help manage her weight, gave you energy, lower stress, and improve his sleep.  The American diabetes Association (ADA) recommends being active for 2-1/2 hours (150 minutes) or more week.  Exercise for 30 minutes, 5 days a week (150 minutes total)     It was a pleasure to see you and I look forward to continuing to work together on your health and well-being. Please do not hesitate to call the office if you need care or have questions about your care.   Have a wonderful day and week. With Gratitude, GAlvira MondayMSN, FNP-BC

## 2022-03-27 NOTE — Assessment & Plan Note (Addendum)
Denies suicidal thoughts and ideation Well-controlled on Wellbutrin and Prozac daily Encouraged to continue on treatment regimen

## 2022-03-27 NOTE — Assessment & Plan Note (Signed)
Chronic condition No recent injury or trauma to the affected sites She follows up orthopedics for chronic left hip pain and lumbar pain She rates pain 2 out of 10 She reports taking naproxen 500 mg as needed for pain relief Encouraged to continue treatment regimen Encouraged heat application to the affected site as needed

## 2022-03-27 NOTE — Assessment & Plan Note (Signed)
She takes pravastatin 40 mg daily Denies abdominal pain, nausea and vomiting No muscle aches and pain reported Lab Results  Component Value Date   CHOL 153 11/13/2021   HDL 69 11/13/2021   LDLCALC 72 11/13/2021   TRIG 55 11/13/2021   CHOLHDL 2.2 11/13/2021

## 2022-04-08 ENCOUNTER — Other Ambulatory Visit: Payer: Self-pay | Admitting: Nurse Practitioner

## 2022-04-09 ENCOUNTER — Telehealth: Payer: Self-pay | Admitting: Family Medicine

## 2022-04-09 NOTE — Telephone Encounter (Signed)
Roman, Deliver my meds, [phone]    Wants to know status of forms

## 2022-04-09 NOTE — Telephone Encounter (Signed)
Pt has not used this service. She must call if she wants her meds sent to them

## 2022-05-27 NOTE — Assessment & Plan Note (Addendum)
Complains of knee pain No systematic symptoms reported No inflammatory symptoms reported Knee pain likely due to arthritis and the patient's pain The patient will benefit from knee orthosis

## 2022-06-18 DIAGNOSIS — E559 Vitamin D deficiency, unspecified: Secondary | ICD-10-CM | POA: Diagnosis not present

## 2022-06-18 DIAGNOSIS — I10 Essential (primary) hypertension: Secondary | ICD-10-CM | POA: Diagnosis not present

## 2022-06-18 DIAGNOSIS — E7849 Other hyperlipidemia: Secondary | ICD-10-CM | POA: Diagnosis not present

## 2022-06-18 DIAGNOSIS — R7301 Impaired fasting glucose: Secondary | ICD-10-CM | POA: Diagnosis not present

## 2022-06-19 LAB — CBC WITH DIFFERENTIAL/PLATELET
Basophils Absolute: 0.1 10*3/uL (ref 0.0–0.2)
Basos: 2 %
EOS (ABSOLUTE): 0.2 10*3/uL (ref 0.0–0.4)
Eos: 5 %
Hematocrit: 40.9 % (ref 34.0–46.6)
Hemoglobin: 13.3 g/dL (ref 11.1–15.9)
Immature Grans (Abs): 0 10*3/uL (ref 0.0–0.1)
Immature Granulocytes: 0 %
Lymphocytes Absolute: 1.3 10*3/uL (ref 0.7–3.1)
Lymphs: 30 %
MCH: 28 pg (ref 26.6–33.0)
MCHC: 32.5 g/dL (ref 31.5–35.7)
MCV: 86 fL (ref 79–97)
Monocytes Absolute: 0.3 10*3/uL (ref 0.1–0.9)
Monocytes: 8 %
Neutrophils Absolute: 2.6 10*3/uL (ref 1.4–7.0)
Neutrophils: 55 %
Platelets: 269 10*3/uL (ref 150–450)
RBC: 4.75 x10E6/uL (ref 3.77–5.28)
RDW: 13.2 % (ref 11.7–15.4)
WBC: 4.5 10*3/uL (ref 3.4–10.8)

## 2022-06-19 LAB — LIPID PANEL
Chol/HDL Ratio: 2.3 ratio (ref 0.0–4.4)
Cholesterol, Total: 176 mg/dL (ref 100–199)
HDL: 78 mg/dL (ref >39)
LDL Chol Calc (NIH): 84 mg/dL (ref 0–99)
Triglycerides: 72 mg/dL (ref 0–149)
VLDL Cholesterol Cal: 14 mg/dL (ref 5–40)

## 2022-06-19 LAB — CMP14+EGFR
ALT: 14 IU/L (ref 0–32)
AST: 16 IU/L (ref 0–40)
Albumin/Globulin Ratio: 1.9 (ref 1.2–2.2)
Albumin: 4.2 g/dL (ref 3.9–4.9)
Alkaline Phosphatase: 71 IU/L (ref 44–121)
BUN/Creatinine Ratio: 12 (ref 12–28)
BUN: 12 mg/dL (ref 8–27)
Bilirubin Total: 0.3 mg/dL (ref 0.0–1.2)
CO2: 25 mmol/L (ref 20–29)
Calcium: 9.7 mg/dL (ref 8.7–10.3)
Chloride: 106 mmol/L (ref 96–106)
Creatinine, Ser: 1.01 mg/dL — ABNORMAL HIGH (ref 0.57–1.00)
Globulin, Total: 2.2 g/dL (ref 1.5–4.5)
Glucose: 103 mg/dL — ABNORMAL HIGH (ref 70–99)
Potassium: 4.4 mmol/L (ref 3.5–5.2)
Sodium: 142 mmol/L (ref 134–144)
Total Protein: 6.4 g/dL (ref 6.0–8.5)
eGFR: 61 mL/min/{1.73_m2} (ref >59)

## 2022-06-19 LAB — HEMOGLOBIN A1C
Est. average glucose Bld gHb Est-mCnc: 120 mg/dL
Hgb A1c MFr Bld: 5.8 % — ABNORMAL HIGH (ref 4.8–5.6)

## 2022-06-19 LAB — VITAMIN D 25 HYDROXY (VIT D DEFICIENCY, FRACTURES): Vit D, 25-Hydroxy: 44 ng/mL (ref 30.0–100.0)

## 2022-06-21 NOTE — Progress Notes (Signed)
Hemoglobin A1c is 5.8, this indicates you are prediabetic. I recommend avoiding simple carbohydrates, including cakes, sweet desserts, ice cream, soda (diet or regular), sweet tea, candies, chips, cookies, store-bought juices, alcohol in excess of 1-2 drinks a day, lemonade, artificial sweeteners, donuts, coffee creamers, and sugar-free products.  I recommend avoiding greasy, fatty foods with increased physical activity.   All other labs are stable

## 2022-06-26 ENCOUNTER — Ambulatory Visit: Payer: 59 | Admitting: Family Medicine

## 2022-06-27 ENCOUNTER — Ambulatory Visit (INDEPENDENT_AMBULATORY_CARE_PROVIDER_SITE_OTHER): Payer: 59 | Admitting: Family Medicine

## 2022-06-27 ENCOUNTER — Encounter: Payer: Self-pay | Admitting: Family Medicine

## 2022-06-27 VITALS — BP 102/68 | HR 75 | Ht <= 58 in | Wt 180.1 lb

## 2022-06-27 DIAGNOSIS — T7849XA Other allergy, initial encounter: Secondary | ICD-10-CM | POA: Diagnosis not present

## 2022-06-27 DIAGNOSIS — R7303 Prediabetes: Secondary | ICD-10-CM

## 2022-06-27 DIAGNOSIS — I10 Essential (primary) hypertension: Secondary | ICD-10-CM | POA: Diagnosis not present

## 2022-06-27 DIAGNOSIS — T7840XA Allergy, unspecified, initial encounter: Secondary | ICD-10-CM

## 2022-06-27 NOTE — Assessment & Plan Note (Signed)
Controlled Encouraged to continue taking olmesartan-amlodipine-hydrochlorothiazide 40- 10-12.5 mg daily No changes to medication regimen today BP Readings from Last 3 Encounters:  06/27/22 102/68  03/27/22 125/67  12/24/21 127/70

## 2022-06-27 NOTE — Assessment & Plan Note (Signed)
Recent hemoglobin A1c is 5.8 Inform the patient that she is prediabetic Encouraged lifestyle modification with a heart healthy diet and increase physical activity Lab Results  Component Value Date   HGBA1C 5.8 (H) 06/18/2022

## 2022-06-27 NOTE — Progress Notes (Addendum)
 Established Patient Office Visit  Subjective:  Patient ID: Victoria Graves, female    DOB: 04-25-1955  Age: 67 y.o. MRN: [id number]  CC:  Chief Complaint  Patient presents with   Care Management    3 month f/u, would like to discuss a referral to specialist that freezes fat in your stomach. Would like to discuss A1c level.     HPI Victoria Graves is a 67 y.o. female with past medical history hypertension, HLD and obesity of  presents for f/u of chronic medical conditions. For the details of today's visit, please refer to the assessment and plan.     Past Medical History:  Diagnosis Date   Allergy    Anxiety    Arthritis    CKD (chronic kidney disease) stage 3, GFR 30-59 ml/min (HCC) 05/08/2021   Depression    GERD (gastroesophageal reflux disease)    Hyperlipidemia    Hypertension    Neuropathy 01/28/2023    Past Surgical History:  Procedure Laterality Date   ABDOMINAL HYSTERECTOMY  1999   fibroid   COLONOSCOPY N/A 08/21/2016   Procedure: COLONOSCOPY;  Surgeon: Victoria Jubilee, MD;  Location: [facility];  Service: Endoscopy;  Laterality: N/A;  9:30 AM   COLONOSCOPY WITH PROPOFOL  N/A 10/23/2021   Procedure: COLONOSCOPY WITH PROPOFOL ;  Surgeon: Victoria Greening, DO;  Location: [facility];  Service: Endoscopy;  Laterality: N/A;  8:45 AM  ASA 2   POLYPECTOMY  08/21/2016   Procedure: POLYPECTOMY;  Surgeon: Victoria Jubilee, MD;  Location: [facility];  Service: Endoscopy;;  ascending colon and sigmoid x2   POLYPECTOMY  10/23/2021   Procedure: POLYPECTOMY;  Surgeon: Victoria Greening, DO;  Location: [facility];  Service: Endoscopy;;    Family History  Problem Relation Age of Onset   Hypertension Mother    Arthritis Mother    Stomach cancer Mother    Heart disease Maternal Grandmother    Kidney disease Maternal Grandmother    Breast cancer Neg Hx    Cancer - Lung Neg Hx     Social History   Socioeconomic History   Marital status: Divorced    Spouse name:  Not on file   Number of children: 0   Years of education: 10   Highest education level: Not on file  Occupational History   Occupation: disability    Comment: back and hip  Tobacco Use   Smoking status: Every Day    Current packs/day: 0.10    Average packs/day: 0.1 packs/day for 20.0 years (2.0 ttl pk-yrs)    Types: Cigarettes   Smokeless tobacco: Never   Tobacco comments:    Smokes a pack a week. Smoking since age 59.   Vaping Use   Vaping status: Never Used  Substance and Sexual Activity   Alcohol use: Yes    Comment: occasional   Drug use: No   Sexual activity: Not Currently    Birth control/protection: Surgical    Comment: total hystetrectomy  Other Topics Concern   Not on file  Social History Narrative   Lives with friend and cousin who has mental disability   Social Drivers of Corporate investment banker Strain: Low Risk  (11/06/2022)   Overall Financial Resource Strain (CARDIA)    Difficulty of Paying Living Expenses: Not hard at all  Food Insecurity: No Food Insecurity (11/06/2022)   Hunger Vital Sign    Worried About Running Out of Food in the Last Year: Never  true    Ran Out of Food in the Last Year: Never true  Transportation Needs: No Transportation Needs (11/06/2022)   PRAPARE - Administrator, Civil Service (Medical): No    Lack of Transportation (Non-Medical): No  Physical Activity: Inactive (11/06/2022)   Exercise Vital Sign    Days of Exercise per Week: 0 days    Minutes of Exercise per Session: 0 min  Stress: No Stress Concern Present (11/06/2022)   [employer] of Occupational Health - Occupational Stress Questionnaire    Feeling of Stress : Not at all  Social Connections: Moderately Isolated (11/06/2022)   Social Connection and Isolation Panel [NHANES]    Frequency of Communication with Friends and Family: More than three times a week    Frequency of Social Gatherings with Friends and Family: More than three times a week    Attends  Religious Services: 1 to 4 times per year    Active Member of [employer] or Organizations: No    Attends Banker Meetings: Never    Marital Status: Divorced  Catering manager Violence: Not At Risk (11/06/2022)   Humiliation, Afraid, Rape, and Kick questionnaire    Fear of Current or Ex-Partner: No    Emotionally Abused: No    Physically Abused: No    Sexually Abused: No    Outpatient Medications Prior to Visit  Medication Sig Dispense Refill   acetaminophen  (TYLENOL ) 500 MG tablet Take 1,000 mg by mouth daily.     aspirin EC 81 MG tablet Take 81 mg by mouth daily.     Biotin w/ Vitamins C & E (HAIR/SKIN/NAILS PO) Take 2 tablets by mouth daily. 2.500 mcg     CALCIUM PO Take 600 mg by mouth in the morning.     Cholecalciferol (VITAMIN D ) 2000 units CAPS Take 2,000 Units by mouth in the morning.     Cyanocobalamin (VITAMIN B 12 PO) Take 5,000 Units by mouth daily. Once a day     cycloSPORINE (RESTASIS) 0.05 % ophthalmic emulsion Place 2 drops into both eyes daily.     docusate sodium (COLACE) 100 MG capsule Take 100 mg by mouth daily. (Patient not taking: Reported on 06/05/2023)     fluticasone (FLONASE) 50 MCG/ACT nasal spray Place 2 sprays into both nostrils daily as needed for allergies.     Metamucil Fiber CHEW Chew 15 mg by mouth daily. 3 gummies 5 mg each     Multiple Vitamins-Minerals (WOMENS MULTI GUMMIES PO) Take 2 tablets by mouth daily at 2 am. Vitafusion 2 gummies     naproxen  (NAPROSYN ) 500 MG tablet Take 500 mg by mouth daily as needed for mild pain or moderate pain.     buPROPion  (WELLBUTRIN  XL) 150 MG 24 hr tablet TAKE 1 TABLET BY MOUTH DAILY 90 tablet 3   citalopram  (CELEXA ) 20 MG tablet Take 1 tablet (20 mg total) by mouth daily. 90 tablet 1   Olmesartan -amLODIPine -HCTZ 40-10-12.5 MG TABS Take 1 tablet by mouth daily. 90 tablet 1   pravastatin  (PRAVACHOL ) 40 MG tablet Take 1 tablet (40 mg total) by mouth daily. 90 tablet 3   No facility-administered medications  prior to visit.    Allergies  Allergen Reactions   Lisinopril Cough   Penicillins Rash and Dermatitis    Has patient had a PCN reaction causing immediate rash, facial/tongue/throat swelling, SOB or lightheadedness with hypotension: Yes  Has patient had a PCN reaction causing severe rash involving mucus membranes or skin necrosis: No  Has patient had a PCN reaction that required hospitalization: No  Has patient had a PCN reaction occurring within the last 10 years: No  If all of the above answers are "NO", then may proceed with Cephalosporin use.  Has patient had a PCN reaction causing immediate rash, facial/tongue/throat swelling, SOB or lightheadedness with hypotension: Yes, Has patient had a PCN reaction causing severe rash involving mucus membranes or skin necrosis: No, Has patient had a PCN reaction that required hospitalization: No, Has patient had a PCN reaction occurring within the last 10 years: No, If all of the above answers are "NO", then may proceed with Cephalosporin use.    ROS Review of Systems  Constitutional:  Negative for chills and fever.  Eyes:  Negative for visual disturbance.  Respiratory:  Negative for chest tightness and shortness of breath.   Neurological:  Negative for dizziness and headaches.      Objective:    Physical Exam HENT:     Head: Normocephalic.     Mouth/Throat:     Mouth: Mucous membranes are moist.  Cardiovascular:     Rate and Rhythm: Normal rate.     Heart sounds: Normal heart sounds.  Pulmonary:     Effort: Pulmonary effort is normal.     Breath sounds: Normal breath sounds.  Neurological:     Mental Status: She is alert.     BP 102/68   Pulse 75   Ht 4\' 8"  (1.422 m)   Wt 180 lb 1.9 oz (81.7 kg)   SpO2 98%   BMI 40.38 kg/m  Wt Readings from Last 3 Encounters:  06/05/23 186 lb 0.6 oz (84.4 kg)  01/27/23 189 lb 1.9 oz (85.8 kg)  11/06/22 181 lb (82.1 kg)    Lab Results  Component Value Date   TSH 4.370 01/20/2023    Lab Results  Component Value Date   WBC 4.8 01/20/2023   HGB 12.9 01/20/2023   HCT 38.6 01/20/2023   MCV 86 01/20/2023   PLT 268 01/20/2023   Lab Results  Component Value Date   NA 140 01/20/2023   K 4.4 01/20/2023   CO2 24 01/20/2023   GLUCOSE 101 (H) 01/20/2023   BUN 13 01/20/2023   CREATININE 1.15 (H) 01/20/2023   BILITOT 0.3 01/20/2023   ALKPHOS 69 01/20/2023   AST 17 01/20/2023   ALT 14 01/20/2023   PROT 6.1 01/20/2023   ALBUMIN 4.1 01/20/2023   CALCIUM 9.4 01/20/2023   ANIONGAP 7 10/15/2021   EGFR 53 (L) 01/20/2023   Lab Results  Component Value Date   CHOL 191 01/20/2023   Lab Results  Component Value Date   HDL 78 01/20/2023   Lab Results  Component Value Date   LDLCALC 98 01/20/2023   Lab Results  Component Value Date   TRIG 84 01/20/2023   Lab Results  Component Value Date   CHOLHDL 2.4 01/20/2023   Lab Results  Component Value Date   HGBA1C 5.9 (H) 01/20/2023      Assessment & Plan:  Prediabetes Assessment & Plan: Recent hemoglobin A1c is 5.8 Inform the patient that she is prediabetic Encouraged lifestyle modification with a heart healthy diet and increase physical activity Lab Results  Component Value Date   HGBA1C 5.8 (H) 06/18/2022      allergy, initial encounter Assessment & Plan: She reports symptoms of sneezing, watery eyes whenever she gets her nails done Ongoing, chronic She is unaware of her allergies and would like to be tested Referral  placed to allergy   Allergy, initial encounter -     Ambulatory referral to Allergy  Primary hypertension Assessment & Plan: Controlled Encouraged to continue taking olmesartan -amlodipine -hydrochlorothiazide 40- 10-12.5 mg daily No changes to medication regimen today BP Readings from Last 3 Encounters:  06/27/22 102/68  03/27/22 125/67  12/24/21 127/70      Resources provided to the patient for body sculpting and [place].  Note: This chart has been completed using  Engineer, civil (consulting) software, and while attempts have been made to ensure accuracy, certain words and phrases may not be transcribed as intended.    Follow-up: Return in about 3 months (around 09/27/2022).   Genesys Coggeshall, FNP

## 2022-06-27 NOTE — Patient Instructions (Addendum)
I appreciate the opportunity to provide care to you today!    Follow up:  3 months  Labs: next visit  Body sculpting/CoolSculpting -[facility] - Dr. Eloise Levels, MD [phone] -[employer] [phone]   Please continue to a heart-healthy diet and increase your physical activities. Try to exercise for at least five days a week.      It was a pleasure to see you and I look forward to continuing to work together on your health and well-being. Please do not hesitate to call the office if you need care or have questions about your care.   Have a wonderful day and week. With Gratitude, Gilmore Laroche MSN, FNP-BC

## 2022-06-27 NOTE — Assessment & Plan Note (Signed)
She reports symptoms of sneezing, watery eyes whenever she gets her nails done Ongoing, chronic She is unaware of her allergies and would like to be tested Referral placed to allergy

## 2022-08-06 ENCOUNTER — Other Ambulatory Visit (HOSPITAL_COMMUNITY): Payer: Self-pay | Admitting: Family Medicine

## 2022-08-06 DIAGNOSIS — Z1231 Encounter for screening mammogram for malignant neoplasm of breast: Secondary | ICD-10-CM

## 2022-08-12 ENCOUNTER — Ambulatory Visit (HOSPITAL_COMMUNITY)
Admission: RE | Admit: 2022-08-12 | Discharge: 2022-08-12 | Disposition: A | Discharge status: Home / Self Care | Payer: 59 | Source: Ambulatory Visit | Attending: Family Medicine | Admitting: Family Medicine

## 2022-08-12 DIAGNOSIS — Z1231 Encounter for screening mammogram for malignant neoplasm of breast: Secondary | ICD-10-CM | POA: Diagnosis not present

## 2022-08-19 ENCOUNTER — Ambulatory Visit: Payer: 59 | Admitting: Internal Medicine

## 2022-09-27 ENCOUNTER — Ambulatory Visit (INDEPENDENT_AMBULATORY_CARE_PROVIDER_SITE_OTHER): Payer: 59 | Admitting: Family Medicine

## 2022-09-27 ENCOUNTER — Encounter: Payer: Self-pay | Admitting: Family Medicine

## 2022-09-27 VITALS — BP 111/68 | HR 83 | Ht <= 58 in | Wt 183.1 lb

## 2022-09-27 DIAGNOSIS — E038 Other specified hypothyroidism: Secondary | ICD-10-CM | POA: Diagnosis not present

## 2022-09-27 DIAGNOSIS — E7849 Other hyperlipidemia: Secondary | ICD-10-CM | POA: Diagnosis not present

## 2022-09-27 DIAGNOSIS — I10 Essential (primary) hypertension: Secondary | ICD-10-CM | POA: Diagnosis not present

## 2022-09-27 DIAGNOSIS — T148XXA Other injury of unspecified body region, initial encounter: Secondary | ICD-10-CM

## 2022-09-27 DIAGNOSIS — E559 Vitamin D deficiency, unspecified: Secondary | ICD-10-CM

## 2022-09-27 DIAGNOSIS — R7301 Impaired fasting glucose: Secondary | ICD-10-CM

## 2022-09-27 DIAGNOSIS — R234 Changes in skin texture: Secondary | ICD-10-CM | POA: Diagnosis not present

## 2022-09-27 DIAGNOSIS — Z23 Encounter for immunization: Secondary | ICD-10-CM

## 2022-09-27 MED ORDER — TRIAMCINOLONE ACETONIDE 0.1 % EX CREA
1.0000 | TOPICAL_CREAM | Freq: Two times a day (BID) | CUTANEOUS | 0 refills | Status: DC
Start: 2022-09-27 — End: 2022-10-11

## 2022-09-27 NOTE — Patient Instructions (Addendum)
I appreciate the opportunity to provide care to you today!    Follow up:  4 months  Labs: please stop by the lab during the week  to get your blood drawn (CBC, CMP, TSH, Lipid profile, HgA1c, Vit D)  For muscle strain, I recommend the following: Rest: Allow the affected muscle time to recover. Gentle Stretching: Engage in light stretching exercises to alleviate tension. Over-the-Counter Pain Relievers: Consider using acetaminophen to manage pain. Heat or Cold Packs: Applying heat or cold packs may provide additional relief. Please contact our office if you experience any of the following:  Severe Pain: Persistent or worsening pain, particularly if it is sudden. Swelling: Significant swelling or redness, especially if localized to one side or accompanied by warmth. Numbness or Weakness: Any sensations of numbness, tingling, or weakness in addition to pain. Leg Ulcers or Skin Changes: Development of sores or other changes in the skin of the leg. Additionally, please visit your local pharmacy to receive your Tdap and shingles vaccinations.  For peeling of the hands and feet, I recommend the following:  Moisturization: Apply a moisturizing cream daily to prevent dryness and maintain skin hydration. Topical Treatment: Apply Kenalog 0.1% cream twice daily for 7 days to reduce inflammation and itching. Please follow these recommendations to help alleviate symptoms. If you experience any worsening of the condition or if symptoms persist, do not hesitate to contact our office.  Weight Loss Tips -Incorporate Nutrient-Dense Foods: Increase your intake of fruits, vegetables, and whole grains to enhance your diet with essential vitamins, minerals, and fiber. -Choose Lean Proteins: Opt for lean protein sources such as chicken, fish, beans, and legumes to support muscle maintenance and overall health. -Select Low-Fat Dairy Products: Include low-fat dairy options to obtain necessary calcium and protein  while minimizing excess fat intake. -Reduce Saturated and Trans Fats: Limit your consumption of saturated fats, trans fatty acids, and cholesterol to promote heart health and support weight management. -Engage in Regular Physical Activity: Aim to be active for at least 30 minutes on most days of the week. Activities such as brisk walking can help you maintain a healthy weight and improve overall well-being.    Please continue to a heart-healthy diet and increase your physical activities. Try to exercise for at least five days a week.    It was a pleasure to see you and I look forward to continuing to work together on your health and well-being. Please do not hesitate to call the office if you need care or have questions about your care.  In case of emergency, please visit the Emergency Department for urgent care, or contact our clinic at [phone] to schedule an appointment. We're here to help you!   Have a wonderful day and week. With Gratitude, Gilmore Laroche MSN, FNP-BC

## 2022-09-27 NOTE — Progress Notes (Unsigned)
Established Patient Office Visit  Subjective:  Patient ID: Victoria Graves, female    DOB: 1956-01-04  Age: 67 y.o. MRN: [id number]  CC:  Chief Complaint  Patient presents with   Care Management    3 month f/u, pt reports leg pain on right side radiates down to the bottom of her feet. Also reports skin peeling on hands and feet.     HPI Victoria Graves is a 67 y.o. female with past medical history of HTN , HLP presents for f/u of chronic medical conditions. For the details of today's visit, please refer to the assessment and plan.    Past Medical History:  Diagnosis Date   Allergy    Anxiety    Arthritis    CKD (chronic kidney disease) stage 3, GFR 30-59 ml/min (HCC) 05/08/2021   Depression    GERD (gastroesophageal reflux disease)    Hyperlipidemia    Hypertension     Past Surgical History:  Procedure Laterality Date   ABDOMINAL HYSTERECTOMY  1999   fibroid   COLONOSCOPY N/A 08/21/2016   Procedure: COLONOSCOPY;  Surgeon: West Bali, MD;  Location: [facility];  Service: Endoscopy;  Laterality: N/A;  9:30 AM   COLONOSCOPY WITH PROPOFOL N/A 10/23/2021   Procedure: COLONOSCOPY WITH PROPOFOL;  Surgeon: Lanelle Bal, DO;  Location: [facility];  Service: Endoscopy;  Laterality: N/A;  8:45 AM  ASA 2   POLYPECTOMY  08/21/2016   Procedure: POLYPECTOMY;  Surgeon: West Bali, MD;  Location: [facility];  Service: Endoscopy;;  ascending colon and sigmoid x2   POLYPECTOMY  10/23/2021   Procedure: POLYPECTOMY;  Surgeon: Lanelle Bal, DO;  Location: [facility];  Service: Endoscopy;;    Family History  Problem Relation Age of Onset   Hypertension Mother    Arthritis Mother    Stomach cancer Mother    Heart disease Maternal Grandmother    Kidney disease Maternal Grandmother    Breast cancer Neg Hx    Cancer - Lung Neg Hx     Social History   Socioeconomic History   Marital status: Divorced    Spouse name: Not on file   Number of children: 0    Years of education: 10   Highest education level: Not on file  Occupational History   Occupation: disability    Comment: back and hip  Tobacco Use   Smoking status: Every Day    Current packs/day: 0.10    Average packs/day: 0.1 packs/day for 20.0 years (2.0 ttl pk-yrs)    Types: Cigarettes   Smokeless tobacco: Never   Tobacco comments:    Smokes a pack a week. Smoking since age 11.   Vaping Use   Vaping status: Never Used  Substance and Sexual Activity   Alcohol use: Yes    Comment: occasional   Drug use: No   Sexual activity: Yes    Birth control/protection: Surgical    Comment: total hystetrectomy  Other Topics Concern   Not on file  Social History Narrative   Lives with friend and cousin who has mental disability   Social Determinants of Health   Financial Resource Strain: Low Risk  (10/15/2021)   Overall Financial Resource Strain (CARDIA)    Difficulty of Paying Living Expenses: Not hard at all  Food Insecurity: No Food Insecurity (10/15/2021)   Hunger Vital Sign    Worried About Running Out of Food in the Last Year: Never true    Ran  Out of Food in the Last Year: Never true  Transportation Needs: No Transportation Needs (10/15/2021)   PRAPARE - Administrator, Civil Service (Medical): No    Lack of Transportation (Non-Medical): No  Physical Activity: Insufficiently Active (10/15/2021)   Exercise Vital Sign    Days of Exercise per Week: 3 days    Minutes of Exercise per Session: 30 min  Stress: No Stress Concern Present (10/15/2021)   [employer] of Occupational Health - Occupational Stress Questionnaire    Feeling of Stress : Not at all  Social Connections: Moderately Integrated (10/15/2021)   Social Connection and Isolation Panel [NHANES]    Frequency of Communication with Friends and Family: More than three times a week    Frequency of Social Gatherings with Friends and Family: More than three times a week    Attends Religious Services: More than 4  times per year    Active Member of [employer] or Organizations: Yes    Attends Engineer, structural: More than 4 times per year    Marital Status: Divorced  Intimate Partner Violence: Not At Risk (10/15/2021)   Humiliation, Afraid, Rape, and Kick questionnaire    Fear of Current or Ex-Partner: No    Emotionally Abused: No    Physically Abused: No    Sexually Abused: No    Outpatient Medications Prior to Visit  Medication Sig Dispense Refill   acetaminophen (TYLENOL) 500 MG tablet Take 1,000 mg by mouth daily.     aspirin EC 81 MG tablet Take 81 mg by mouth daily.     Biotin w/ Vitamins C & E (HAIR/SKIN/NAILS PO) Take 2 tablets by mouth daily. 2.500 mcg     buPROPion (WELLBUTRIN XL) 150 MG 24 hr tablet TAKE 1 TABLET BY MOUTH DAILY 90 tablet 3   CALCIUM PO Take 600 mg by mouth in the morning.     Cholecalciferol (VITAMIN D) 2000 units CAPS Take 2,000 Units by mouth in the morning.     citalopram (CELEXA) 20 MG tablet Take 1 tablet (20 mg total) by mouth daily. 90 tablet 1   Cyanocobalamin (VITAMIN B 12 PO) Take 5,000 Units by mouth daily. Once a day     cycloSPORINE (RESTASIS) 0.05 % ophthalmic emulsion Place 2 drops into both eyes daily.     docusate sodium (COLACE) 100 MG capsule Take 100 mg by mouth daily.     fluticasone (FLONASE) 50 MCG/ACT nasal spray Place 2 sprays into both nostrils daily as needed for allergies.     Metamucil Fiber CHEW Chew 15 mg by mouth daily. 3 gummies 5 mg each     Multiple Vitamins-Minerals (WOMENS MULTI GUMMIES PO) Take 2 tablets by mouth daily at 2 am. Vitafusion 2 gummies     naproxen (NAPROSYN) 500 MG tablet Take 500 mg by mouth daily as needed for mild pain or moderate pain.     Olmesartan-amLODIPine-HCTZ 40-10-12.5 MG TABS Take 1 tablet by mouth daily. 90 tablet 1   pravastatin (PRAVACHOL) 40 MG tablet Take 1 tablet (40 mg total) by mouth daily. 90 tablet 3   No facility-administered medications prior to visit.    Allergies  Allergen  Reactions   Lisinopril Cough   Penicillins Rash    Has patient had a PCN reaction causing immediate rash, facial/tongue/throat swelling, SOB or lightheadedness with hypotension: Yes Has patient had a PCN reaction causing severe rash involving mucus membranes or skin necrosis: No Has patient had a PCN reaction that required hospitalization:  No Has patient had a PCN reaction occurring within the last 10 years: No If all of the above answers are "NO", then may proceed with Cephalosporin use.     ROS Review of Systems  Constitutional:  Negative for chills and fever.  Eyes:  Negative for visual disturbance.  Respiratory:  Negative for chest tightness and shortness of breath.   Genitourinary:        Leg pain on right side  Skin:        Peeling of the hands and feet  Neurological:  Negative for dizziness and headaches.      Objective:    Physical Exam HENT:     Head: Normocephalic.     Mouth/Throat:     Mouth: Mucous membranes are moist.  Cardiovascular:     Rate and Rhythm: Normal rate.     Heart sounds: Normal heart sounds.  Pulmonary:     Effort: Pulmonary effort is normal.     Breath sounds: Normal breath sounds.  Neurological:     Mental Status: She is alert.     BP 111/68   Pulse 83   Ht 4\' 8"  (1.422 m)   Wt 183 lb 1.3 oz (83 kg)   SpO2 95%   BMI 41.05 kg/m  Wt Readings from Last 3 Encounters:  09/27/22 183 lb 1.3 oz (83 kg)  06/27/22 180 lb 1.9 oz (81.7 kg)  03/27/22 183 lb 0.6 oz (83 kg)    Lab Results  Component Value Date   TSH 4.320 11/13/2021   Lab Results  Component Value Date   WBC 4.5 06/18/2022   HGB 13.3 06/18/2022   HCT 40.9 06/18/2022   MCV 86 06/18/2022   PLT 269 06/18/2022   Lab Results  Component Value Date   NA 142 06/18/2022   K 4.4 06/18/2022   CO2 25 06/18/2022   GLUCOSE 103 (H) 06/18/2022   BUN 12 06/18/2022   CREATININE 1.01 (H) 06/18/2022   BILITOT 0.3 06/18/2022   ALKPHOS 71 06/18/2022   AST 16 06/18/2022   ALT 14  06/18/2022   PROT 6.4 06/18/2022   ALBUMIN 4.2 06/18/2022   CALCIUM 9.7 06/18/2022   ANIONGAP 7 10/15/2021   EGFR 61 06/18/2022   Lab Results  Component Value Date   CHOL 176 06/18/2022   Lab Results  Component Value Date   HDL 78 06/18/2022   Lab Results  Component Value Date   LDLCALC 84 06/18/2022   Lab Results  Component Value Date   TRIG 72 06/18/2022   Lab Results  Component Value Date   CHOLHDL 2.3 06/18/2022   Lab Results  Component Value Date   HGBA1C 5.8 (H) 06/18/2022      Assessment & Plan:  Primary hypertension Assessment & Plan: The patient's condition is controlled. He is encouraged to continue taking olmesartan-amlodipine-hydrochlorothiazide 40-10-12.5 mg daily. A low-sodium diet with increased physical activity is recommended. No changes to the medication regimen are needed today. BP Readings from Last 3 Encounters:  09/27/22 111/68  06/27/22 102/68  03/27/22 125/67      Peeling skin Assessment & Plan: The patient complains of peeling of the hands and feet, accompanied by itching. There have been no recent changes in detergent or use of chemicals. She denies frequent hand washing and has no history of atopic dermatitis.  She will be treated today with topical corticosteroids and is encouraged to keep her hands well-moisturized.  Orders: -     Triamcinolone Acetonide; Apply 1 Application topically  2 (two) times daily.  Dispense: 45 g; Refill: 0  Muscle strain Assessment & Plan: The patient complains of right-sided leg pain, described as aching and rated 5/10. The pain is relieved with sitting and increases with activity. There is no swelling, numbness, tingling, redness, warmth, or claudication noted.  The patient is encouraged to take over-the-counter Tylenol as needed for pain relief. Additionally, rest, gentle stretching, and the application of heat or cold packs are recommended. The patient is advised to follow up if severe pain, swelling,  numbness, weakness, or leg ulcers develop.   Other hyperlipidemia Assessment & Plan: The patient takes pravastatin 40 mg daily. She is encouraged to reduce her intake of cholesterol, trans fats, and saturated fats while increasing her physical activity and consumption of fruits, vegetables, and whole grains. She denies any abdominal pain, nausea, or vomiting, and reports no muscle aches or pain. Lab Results  Component Value Date   CHOL 176 06/18/2022   HDL 78 06/18/2022   LDLCALC 84 06/18/2022   TRIG 72 06/18/2022   CHOLHDL 2.3 06/18/2022     Orders: -     Lipid panel -     CMP14+EGFR -     CBC with Differential/Platelet  IFG (impaired fasting glucose) -     Hemoglobin A1c  Vitamin D deficiency -     VITAMIN D 25 Hydroxy (Vit-D Deficiency, Fractures)  Other specified hypothyroidism -     TSH + free T4  Encounter for immunization -     Flu vaccine trivalent PF, 6mos and older(Flulaval,Afluria,Fluarix,Fluzone)  Note: This chart has been completed using [employer] software, and while attempts have been made to ensure accuracy, certain words and phrases may not be transcribed as intended.    Follow-up: Return in about 4 months (around 01/27/2023).   Gilmore Laroche, FNP

## 2022-09-28 DIAGNOSIS — R234 Changes in skin texture: Secondary | ICD-10-CM | POA: Insufficient documentation

## 2022-09-28 DIAGNOSIS — T148XXA Other injury of unspecified body region, initial encounter: Secondary | ICD-10-CM | POA: Insufficient documentation

## 2022-09-28 NOTE — Assessment & Plan Note (Signed)
The patient complains of peeling of the hands and feet, accompanied by itching. There have been no recent changes in detergent or use of chemicals. She denies frequent hand washing and has no history of atopic dermatitis.  She will be treated today with topical corticosteroids and is encouraged to keep her hands well-moisturized.

## 2022-09-28 NOTE — Assessment & Plan Note (Signed)
The patient's condition is controlled. He is encouraged to continue taking olmesartan-amlodipine-hydrochlorothiazide 40-10-12.5 mg daily. A low-sodium diet with increased physical activity is recommended. No changes to the medication regimen are needed today. BP Readings from Last 3 Encounters:  09/27/22 111/68  06/27/22 102/68  03/27/22 125/67

## 2022-09-28 NOTE — Assessment & Plan Note (Signed)
The patient complains of right-sided leg pain, described as aching and rated 5/10. The pain is relieved with sitting and increases with activity. There is no swelling, numbness, tingling, redness, warmth, or claudication noted.  The patient is encouraged to take over-the-counter Tylenol as needed for pain relief. Additionally, rest, gentle stretching, and the application of heat or cold packs are recommended. The patient is advised to follow up if severe pain, swelling, numbness, weakness, or leg ulcers develop.

## 2022-09-28 NOTE — Assessment & Plan Note (Signed)
The patient takes pravastatin 40 mg daily. She is encouraged to reduce her intake of cholesterol, trans fats, and saturated fats while increasing her physical activity and consumption of fruits, vegetables, and whole grains. She denies any abdominal pain, nausea, or vomiting, and reports no muscle aches or pain. Lab Results  Component Value Date   CHOL 176 06/18/2022   HDL 78 06/18/2022   LDLCALC 84 06/18/2022   TRIG 72 06/18/2022   CHOLHDL 2.3 06/18/2022

## 2022-10-07 ENCOUNTER — Other Ambulatory Visit: Payer: Self-pay | Admitting: Family Medicine

## 2022-10-07 DIAGNOSIS — N951 Menopausal and female climacteric states: Secondary | ICD-10-CM

## 2022-10-11 ENCOUNTER — Other Ambulatory Visit: Payer: Self-pay | Admitting: Family Medicine

## 2022-10-11 ENCOUNTER — Telehealth: Payer: Self-pay | Admitting: Family Medicine

## 2022-10-11 DIAGNOSIS — R234 Changes in skin texture: Secondary | ICD-10-CM

## 2022-10-11 MED ORDER — TRIAMCINOLONE ACETONIDE 0.1 % EX CREA
1.0000 | TOPICAL_CREAM | Freq: Two times a day (BID) | CUTANEOUS | 0 refills | Status: DC
Start: 2022-10-11 — End: 2023-01-27

## 2022-10-11 NOTE — Telephone Encounter (Signed)
Rx sent 

## 2022-10-11 NOTE — Telephone Encounter (Signed)
Patient LVM says she used the cream for 7 days- says it worked but the rash is coming back. Please advise

## 2022-10-14 ENCOUNTER — Telehealth: Payer: Self-pay | Admitting: Family Medicine

## 2022-10-14 NOTE — Telephone Encounter (Signed)
Prescription Request  10/14/2022  LOV: 09/27/2022  What is the name of the medication or equipment? citalopram (CELEXA) 20 MG tablet   Have you contacted your pharmacy to request a refill? Yes   Which pharmacy would you like this sent to?  [facility]   Patient notified that their request is being sent to the clinical staff for review and that they should receive a response within 2 business days.   Please advise at Mobile [phone] (mobile)   PATIENT ASKING FOR A RETURN CALL ABOUT THIS MEDICATION

## 2022-10-14 NOTE — Telephone Encounter (Signed)
Vm box full rx was sent on 10/08/22 to [facility],.

## 2022-11-06 ENCOUNTER — Ambulatory Visit (INDEPENDENT_AMBULATORY_CARE_PROVIDER_SITE_OTHER): Payer: 59

## 2022-11-06 VITALS — Ht <= 58 in | Wt 181.0 lb

## 2022-11-06 DIAGNOSIS — Z780 Asymptomatic menopausal state: Secondary | ICD-10-CM

## 2022-11-06 DIAGNOSIS — Z0000 Encounter for general adult medical examination without abnormal findings: Secondary | ICD-10-CM | POA: Diagnosis not present

## 2022-11-06 NOTE — Progress Notes (Signed)
 Because this visit was a virtual/telehealth visit,  certain criteria was not obtained, such a blood pressure, CBG if applicable, and timed get up and go. Any medications not marked as "taking" were not mentioned during the medication reconciliation part of the visit. Any vitals not documented were not able to be obtained due to this being a telehealth visit or patient was unable to self-report a recent blood pressure reading due to a lack of equipment at home via telehealth. Vitals that have been documented are verbally provided by the patient.   Subjective:   Victoria Graves is a 67 y.o. female who presents for Medicare Annual (Subsequent) preventive examination.  Visit Complete: Virtual I connected with  Victoria Graves on 11/06/22 by a audio enabled telemedicine application and verified that I am speaking with the correct person using two identifiers.  Patient Location: Home  Provider Location: Home Office  I discussed the limitations of evaluation and management by telemedicine. The patient expressed understanding and agreed to proceed.  Vital Signs: Because this visit was a virtual/telehealth visit, some criteria may be missing or patient reported. Any vitals not documented were not able to be obtained and vitals that have been documented are patient reported.  Patient Medicare AWV questionnaire was completed by the patient on na; I have confirmed that all information answered by patient is correct and no changes since this date.  Cardiac Risk Factors include: advanced age (>40men, >57 women);dyslipidemia;hypertension;sedentary lifestyle;smoking/ tobacco exposure     Objective:    Today's Vitals   11/06/22 1302 11/06/22 1304  Weight: 181 lb (82.1 kg)   Height: 4\' 8"  (1.422 m)   PainSc:  0-No pain   Body mass index is 40.58 kg/m.     11/06/2022    1:02 PM 10/23/2021    7:50 AM 10/18/2021    3:13 PM 10/15/2021   10:43 AM 07/26/2021    2:54 PM 08/21/2016    8:45 AM 05/29/2016     3:48 PM  Advanced Directives  Does Patient Have a Medical Advance Directive? No No No No No No No  Would patient like information on creating a medical advance directive? No - Patient declined No - Patient declined No - Patient declined No - Patient declined Yes (MAU/Ambulatory/Procedural Areas - Information given)  Yes (MAU/Ambulatory/Procedural Areas - Information given)    Current Medications (verified) Outpatient Encounter Medications as of 11/06/2022  Medication Sig   acetaminophen (TYLENOL) 500 MG tablet Take 1,000 mg by mouth daily.   aspirin EC 81 MG tablet Take 81 mg by mouth daily.   Biotin w/ Vitamins C & E (HAIR/SKIN/NAILS PO) Take 2 tablets by mouth daily. 2.500 mcg   buPROPion (WELLBUTRIN XL) 150 MG 24 hr tablet TAKE 1 TABLET BY MOUTH DAILY   CALCIUM PO Take 600 mg by mouth in the morning.   Cholecalciferol (VITAMIN D) 2000 units CAPS Take 2,000 Units by mouth in the morning.   citalopram (CELEXA) 20 MG tablet Take 1 tablet by mouth once daily   Cyanocobalamin (VITAMIN B 12 PO) Take 5,000 Units by mouth daily. Once a day   cycloSPORINE (RESTASIS) 0.05 % ophthalmic emulsion Place 2 drops into both eyes daily.   docusate sodium (COLACE) 100 MG capsule Take 100 mg by mouth daily.   fluticasone (FLONASE) 50 MCG/ACT nasal spray Place 2 sprays into both nostrils daily as needed for allergies.   Metamucil Fiber CHEW Chew 15 mg by mouth daily. 3 gummies 5 mg each   Multiple Vitamins-Minerals (  WOMENS MULTI GUMMIES PO) Take 2 tablets by mouth daily at 2 am. Vitafusion 2 gummies   naproxen (NAPROSYN) 500 MG tablet Take 500 mg by mouth daily as needed for mild pain or moderate pain.   Olmesartan-amLODIPine-HCTZ 40-10-12.5 MG TABS Take 1 tablet by mouth daily.   pravastatin (PRAVACHOL) 40 MG tablet Take 1 tablet (40 mg total) by mouth daily.   triamcinolone cream (KENALOG) 0.1 % Apply 1 Application topically 2 (two) times daily.   No facility-administered encounter medications on file as  of 11/06/2022.    Allergies (verified) Lisinopril and Penicillins   History: Past Medical History:  Diagnosis Date   Allergy    Anxiety    Arthritis    CKD (chronic kidney disease) stage 3, GFR 30-59 ml/min (HCC) 05/08/2021   Depression    GERD (gastroesophageal reflux disease)    Hyperlipidemia    Hypertension    Past Surgical History:  Procedure Laterality Date   ABDOMINAL HYSTERECTOMY  1999   fibroid   COLONOSCOPY N/A 08/21/2016   Procedure: COLONOSCOPY;  Surgeon: West Bali, MD;  Location: [facility];  Service: Endoscopy;  Laterality: N/A;  9:30 AM   COLONOSCOPY WITH PROPOFOL N/A 10/23/2021   Procedure: COLONOSCOPY WITH PROPOFOL;  Surgeon: Lanelle Bal, DO;  Location: [facility];  Service: Endoscopy;  Laterality: N/A;  8:45 AM  ASA 2   POLYPECTOMY  08/21/2016   Procedure: POLYPECTOMY;  Surgeon: West Bali, MD;  Location: [facility];  Service: Endoscopy;;  ascending colon and sigmoid x2   POLYPECTOMY  10/23/2021   Procedure: POLYPECTOMY;  Surgeon: Lanelle Bal, DO;  Location: [facility];  Service: Endoscopy;;   Family History  Problem Relation Age of Onset   Hypertension Mother    Arthritis Mother    Stomach cancer Mother    Heart disease Maternal Grandmother    Kidney disease Maternal Grandmother    Breast cancer Neg Hx    Cancer - Lung Neg Hx    Social History   Socioeconomic History   Marital status: Divorced    Spouse name: Not on file   Number of children: 0   Years of education: 10   Highest education level: Not on file  Occupational History   Occupation: disability    Comment: back and hip  Tobacco Use   Smoking status: Every Day    Current packs/day: 0.10    Average packs/day: 0.1 packs/day for 20.0 years (2.0 ttl pk-yrs)    Types: Cigarettes   Smokeless tobacco: Never   Tobacco comments:    Smokes a pack a week. Smoking since age 41.   Vaping Use   Vaping status: Never Used  Substance and Sexual Activity   Alcohol  use: Yes    Comment: occasional   Drug use: No   Sexual activity: Yes    Birth control/protection: Surgical    Comment: total hystetrectomy  Other Topics Concern   Not on file  Social History Narrative   Lives with friend and cousin who has mental disability   Social Determinants of Health   Financial Resource Strain: Low Risk  (11/06/2022)   Overall Financial Resource Strain (CARDIA)    Difficulty of Paying Living Expenses: Not hard at all  Food Insecurity: No Food Insecurity (11/06/2022)   Hunger Vital Sign    Worried About Running Out of Food in the Last Year: Never true    Ran Out of Food in the Last Year: Never true  Transportation  Needs: No Transportation Needs (11/06/2022)   PRAPARE - Administrator, Civil Service (Medical): No    Lack of Transportation (Non-Medical): No  Physical Activity: Inactive (11/06/2022)   Exercise Vital Sign    Days of Exercise per Week: 0 days    Minutes of Exercise per Session: 0 min  Stress: No Stress Concern Present (11/06/2022)   [employer] of Occupational Health - Occupational Stress Questionnaire    Feeling of Stress : Not at all  Social Connections: Moderately Isolated (11/06/2022)   Social Connection and Isolation Panel [NHANES]    Frequency of Communication with Friends and Family: More than three times a week    Frequency of Social Gatherings with Friends and Family: More than three times a week    Attends Religious Services: 1 to 4 times per year    Active Member of [employer] or Organizations: No    Attends Banker Meetings: Never    Marital Status: Divorced    Tobacco Counseling Ready to quit: Yes Counseling given: Yes Tobacco comments: Smokes a pack a week. Smoking since age 78.    Clinical Intake:  Pre-visit preparation completed: Yes  Pain : No/denies pain Pain Score: 0-No pain     BMI - recorded: 40.58 Nutritional Status: BMI > 30  Obese Nutritional Risks: None Diabetes: No  How often  do you need to have someone help you when you read instructions, pamphlets, or other written materials from your doctor or pharmacy?: 1 - Never  Interpreter Needed?: No  Information entered by ::  Carmel Garfield, CMA   Activities of Daily Living    11/06/2022    1:22 PM  In your present state of health, do you have any difficulty performing the following activities:  Hearing? 0  Vision? 0  Difficulty concentrating or making decisions? 0  Walking or climbing stairs? 0  Dressing or bathing? 0  Doing errands, shopping? 0  Preparing Food and eating ? N  Using the Toilet? N  In the past six months, have you accidently leaked urine? N  Do you have problems with loss of bowel control? N  Managing your Medications? N  Managing your Finances? N  Housekeeping or managing your Housekeeping? N    Patient Care Team: Gilmore Laroche, FNP as PCP - General (Family Medicine)  Indicate any recent Medical Services you may have received from other than [facility] providers in the past year (date may be approximate).     Assessment:   This is a routine wellness examination for [place].  Hearing/Vision screen Hearing Screening - Comments:: Patient denies any hearing difficulties.   Vision Screening - Comments:: Patient sees [facility] and patient is utd with yearly exams    Goals Addressed             This Visit's Progress    Patient Stated       Lose weight       Depression Screen    11/06/2022    1:09 PM 09/27/2022    2:41 PM 09/27/2022    2:32 PM 06/27/2022    2:25 PM 03/27/2022    1:20 PM 12/24/2021    2:14 PM 11/21/2021    3:12 PM  PHQ 2/9 Scores  PHQ - 2 Score 0 4 0 0 0 0 0  PHQ- 9 Score 0 15 0 0 4 6 2     Fall Risk    11/06/2022    1:21 PM 09/27/2022    2:41 PM  09/27/2022    2:32 PM 06/27/2022    2:25 PM 03/27/2022    1:20 PM  Fall Risk   Falls in the past year? 0 0 0 0 0  Number falls in past yr: 0 0 0 0 0  Injury with Fall? 0 0 0 0 0  Risk for fall due to :  No Fall Risks No Fall Risks No Fall Risks No Fall Risks No Fall Risks  Follow up Falls prevention discussed Falls evaluation completed Falls evaluation completed Falls evaluation completed Falls evaluation completed    MEDICARE RISK AT HOME: Medicare Risk at Home Any stairs in or around the home?: No If so, are there any without handrails?: No Home free of loose throw rugs in walkways, pet beds, electrical cords, etc?: Yes Adequate lighting in your home to reduce risk of falls?: Yes Life alert?: No Use of a cane, walker or w/c?: No Grab bars in the bathroom?: Yes Shower chair or bench in shower?: Yes Elevated toilet seat or a handicapped toilet?: No  TIMED UP AND GO:  Was the test performed?  No    Cognitive Function:    10/15/2021   10:44 AM  MMSE - Mini Mental State Exam  Not completed: Unable to complete        11/06/2022    1:07 PM 10/15/2021   10:44 AM 05/29/2016    3:52 PM  6CIT Screen  What Year? 0 points 0 points 0 points  What month? 0 points 0 points 0 points  What time? 0 points 0 points 0 points  Count back from 20 0 points 0 points 0 points  Months in reverse 0 points 0 points 0 points  Repeat phrase 10 points 0 points 0 points  Total Score 10 points 0 points 0 points    Immunizations Immunization History  Administered Date(s) Administered   Fluad Quad(high Dose 65+) 11/21/2021   Influenza, Seasonal, Injecte, Preservative Fre 09/27/2022   Influenza,inj,Quad PF,6+ Mos 11/29/2015, 02/06/2017   Influenza-Unspecified 11/28/2020   PFIZER Comirnaty(Gray Top)Covid-19 Tri-Sucrose Vaccine 03/02/2020   PFIZER(Purple Top)SARS-COV-2 Vaccination 03/23/2020   PNEUMOCOCCAL CONJUGATE-20 11/21/2021   Tdap 11/29/2015    TDAP status: Up to date  Flu Vaccine status: Up to date  Pneumococcal vaccine status: Up to date  Covid-19 vaccine status: Declined, Education has been provided regarding the importance of this vaccine but patient still declined. Advised may receive  this vaccine at local pharmacy or Health Dept.or vaccine clinic. Aware to provide a copy of the vaccination record if obtained from local pharmacy or Health Dept. Verbalized acceptance and understanding.  Qualifies for Shingles Vaccine? Yes   Zostavax completed No   Shingrix Completed?: No.    Education has been provided regarding the importance of this vaccine. Patient has been advised to call insurance company to determine out of pocket expense if they have not yet received this vaccine. Advised may also receive vaccine at local pharmacy or Health Dept. Verbalized acceptance and understanding.  Screening Tests Health Maintenance  Topic Date Due   Zoster Vaccines- Shingrix (1 of 2) Never done   Medicare Annual Wellness (AWV)  10/16/2022   MAMMOGRAM  08/11/2024   DTaP/Tdap/Td (2 - Td or Tdap) 11/28/2025   Colonoscopy  10/24/2031   Pneumonia Vaccine 14+ Years old  Completed   INFLUENZA VACCINE  Completed   DEXA SCAN  Completed   Hepatitis C Screening  Completed   HPV VACCINES  Aged Out   COVID-19 Vaccine  Discontinued  Health Maintenance  Health Maintenance Due  Topic Date Due   Zoster Vaccines- Shingrix (1 of 2) Never done   Medicare Annual Wellness (AWV)  10/16/2022    Colorectal cancer screening: Type of screening: Colonoscopy. Completed 11/18/2020. Repeat every 5 years  Mammogram status: Completed 08/12/2022. Repeat every year  Bone Density status: Ordered 11/06/2022. Pt provided with contact info and advised to call to schedule appt.  Lung Cancer Screening: (Low Dose CT Chest recommended if Age 41-80 years, 20 pack-year currently smoking OR have quit w/in 15years.) does not qualify.   Lung Cancer Screening Referral: na  Additional Screening:  Hepatitis C Screening: does not qualify; Completed 11/29/2015  Vision Screening: Recommended annual ophthalmology exams for early detection of glaucoma and other disorders of the eye. Is the patient up to date with their annual  eye exam?  Yes  Who is the provider or what is the name of the office in which the patient attends annual eye exams? [facility] If pt is not established with a provider, would they like to be referred to a provider to establish care? No .   Dental Screening: Recommended annual dental exams for proper oral hygiene  Diabetic Foot Exam: na  Community Resource Referral / Chronic Care Management: CRR required this visit?  No   CCM required this visit?  No     Plan:     I have personally reviewed and noted the following in the patient's chart:   Medical and social history Use of alcohol, tobacco or illicit drugs  Current medications and supplements including opioid prescriptions. Patient is not currently taking opioid prescriptions. Functional ability and status Nutritional status Physical activity Advanced directives List of other physicians Hospitalizations, surgeries, and ER visits in previous 12 months Vitals Screenings to include cognitive, depression, and falls Referrals and appointments  In addition, I have reviewed and discussed with patient certain preventive protocols, quality metrics, and best practice recommendations. A written personalized care plan for preventive services as well as general preventive health recommendations were provided to patient.     Jordan Hawks Faelyn Sigler, CMA   11/06/2022   After Visit Summary: (Mail) Due to this being a telephonic visit, the after visit summary with patients personalized plan was offered to patient via mail   Nurse Notes: dexa ordered today

## 2022-11-06 NOTE — Patient Instructions (Addendum)
[name] , Thank you for taking time to come for your Medicare Wellness Visit. I appreciate your ongoing commitment to your health goals. Please review the following plan we discussed and let me know if I can assist you in the future.   Referrals/Orders/Follow-Ups/Clinician Recommendations:  Next Medicare Annual Wellness Visit: November 11, 2023 at 1040am virtual visit  You have an order for:  []   2D Mammogram  []   3D Mammogram  [x]   Bone Density   []   Lung Cancer Screening  Please call for appointment:   [facility] [address] [place], [place] [address] [phone]  Make sure to wear two-piece clothing.  No lotions powders or deodorants the day of the appointment Make sure to bring picture ID and insurance card.  Bring list of medications you are currently taking including any supplements.   Schedule your [place] screening mammogram through MyChart!   Log into your MyChart account.  Go to 'Visit' (or 'Appointments' if on mobile App) --> Schedule an Appointment  Under 'Select a Reason for Visit' choose the Mammogram Screening option.  Complete the pre-visit questions and select the time and place that best fits your schedule.   This is a list of the screening recommended for you and due dates:  Health Maintenance  Topic Date Due   Zoster (Shingles) Vaccine (1 of 2) Never done   DEXA scan (bone density measurement)  11/07/2022   Mammogram  08/12/2023   Medicare Annual Wellness Visit  11/06/2023   DTaP/Tdap/Td vaccine (2 - Td or Tdap) 11/28/2025   Colon Cancer Screening  10/24/2026   Pneumonia Vaccine  Completed   Flu Shot  Completed   Hepatitis C Screening  Completed   HPV Vaccine  Aged Out   COVID-19 Vaccine  Discontinued    Advanced directives: (Declined) Advance directive discussed with you today. Even though you declined this today, please call our office should you change your mind, and we can give you the proper  paperwork for you to fill out.  Next Medicare Annual Wellness Visit scheduled for next year: Yes  Preventive Care 31 Years and Older, Female Preventive care refers to lifestyle choices and visits with your health care provider that can promote health and wellness. Preventive care visits are also called wellness exams. What can I expect for my preventive care visit? Counseling Your health care provider may ask you questions about your: Medical history, including: Past medical problems. Family medical history. Pregnancy and menstrual history. History of falls. Current health, including: Memory and ability to understand (cognition). Emotional well-being. Home life and relationship well-being. Sexual activity and sexual health. Lifestyle, including: Alcohol, nicotine or tobacco, and drug use. Access to firearms. Diet, exercise, and sleep habits. Work and work Astronomer. Sunscreen use. Safety issues such as seatbelt and bike helmet use. Physical exam Your health care provider will check your: Height and weight. These may be used to calculate your BMI (body mass index). BMI is a measurement that tells if you are at a healthy weight. Waist circumference. This measures the distance around your waistline. This measurement also tells if you are at a healthy weight and may help predict your risk of certain diseases, such as type 2 diabetes and high blood pressure. Heart rate and blood pressure. Body temperature. Skin for abnormal spots. What immunizations do I need?  Vaccines are usually given at various ages, according to a schedule. Your health care provider will recommend vaccines for you based  on your age, medical history, and lifestyle or other factors, such as travel or where you work. What tests do I need? Screening Your health care provider may recommend screening tests for certain conditions. This may include: Lipid and cholesterol levels. Hepatitis C test. Hepatitis B  test. HIV (human immunodeficiency virus) test. STI (sexually transmitted infection) testing, if you are at risk. Lung cancer screening. Colorectal cancer screening. Diabetes screening. This is done by checking your blood sugar (glucose) after you have not eaten for a while (fasting). Mammogram. Talk with your health care provider about how often you should have regular mammograms. BRCA-related cancer screening. This may be done if you have a family history of breast, ovarian, tubal, or peritoneal cancers. Bone density scan. This is done to screen for osteoporosis. Talk with your health care provider about your test results, treatment options, and if necessary, the need for more tests. Follow these instructions at home: Eating and drinking  Eat a diet that includes fresh fruits and vegetables, whole grains, lean protein, and low-fat dairy products. Limit your intake of foods with high amounts of sugar, saturated fats, and salt. Take vitamin and mineral supplements as recommended by your health care provider. Do not drink alcohol if your health care provider tells you not to drink. If you drink alcohol: Limit how much you have to 0-1 drink a day. Know how much alcohol is in your drink. In the [place] one drink equals one 12 oz bottle of beer (355 mL), one 5 oz glass of wine (148 mL), or one 1 oz glass of hard liquor (44 mL). Lifestyle Brush your teeth every morning and night with fluoride toothpaste. Floss one time each day. Exercise for at least 30 minutes 5 or more days each week. Do not use any products that contain nicotine or tobacco. These products include cigarettes, chewing tobacco, and vaping devices, such as e-cigarettes. If you need help quitting, ask your health care provider. Do not use drugs. If you are sexually active, practice safe sex. Use a condom or other form of protection in order to prevent STIs. Take aspirin only as told by your health care provider. Make sure that you  understand how much to take and what form to take. Work with your health care provider to find out whether it is safe and beneficial for you to take aspirin daily. Ask your health care provider if you need to take a cholesterol-lowering medicine (statin). Find healthy ways to manage stress, such as: Meditation, yoga, or listening to music. Journaling. Talking to a trusted person. Spending time with friends and family. Minimize exposure to UV radiation to reduce your risk of skin cancer. Safety Always wear your seat belt while driving or riding in a vehicle. Do not drive: If you have been drinking alcohol. Do not ride with someone who has been drinking. When you are tired or distracted. While texting. If you have been using any mind-altering substances or drugs. Wear a helmet and other protective equipment during sports activities. If you have firearms in your house, make sure you follow all gun safety procedures. What's next? Visit your health care provider once a year for an annual wellness visit. Ask your health care provider how often you should have your eyes and teeth checked. Stay up to date on all vaccines. This information is not intended to replace advice given to you by your health care provider. Make sure you discuss any questions you have with your health care provider. Document Revised: 07/12/2020  Document Reviewed: 07/12/2020 Elsevier Patient Education  2024 [employer]. Understanding Your Risk for Falls Millions of people have serious injuries from falls each year. It is important to understand your risk of falling. Talk with your health care provider about your risk and what you can do to lower it. If you do have a serious fall, make sure to tell your provider. Falling once raises your risk of falling again. How can falls affect me? Serious injuries from falls are common. These include: Broken bones, such as hip fractures. Head injuries, such as traumatic brain  injuries (TBI) or concussions. A fear of falling can cause you to avoid activities and stay at home. This can make your muscles weaker and raise your risk for a fall. What can increase my risk? There are a number of risk factors that increase your risk for falling. The more risk factors you have, the higher your risk of falling. Serious injuries from a fall happen most often to people who are older than 66 years old. Teenagers and young adults ages 101-29 are also at higher risk. Common risk factors include: Weakness in the lower body. Being generally weak or confused due to long-term (chronic) illness. Dizziness or balance problems. Poor vision. Medicines that cause dizziness or drowsiness. These may include: Medicines for your blood pressure, heart, anxiety, insomnia, or swelling (edema). Pain medicines. Muscle relaxants. Other risk factors include: Drinking alcohol. Having had a fall in the past. Having foot pain or wearing improper footwear. Working at a dangerous job. Having any of the following in your home: Tripping hazards, such as floor clutter or loose rugs. Poor lighting. Pets. Having dementia or memory loss. What actions can I take to lower my risk of falling?     Physical activity Stay physically fit. Do strength and balance exercises. Consider taking a regular class to build strength and balance. Yoga and tai chi are good options. Vision Have your eyes checked every year and your prescription for glasses or contacts updated as needed. Shoes and walking aids Wear non-skid shoes. Wear shoes that have rubber soles and low heels. Do not wear high heels. Do not walk around the house in socks or slippers. Use a cane or walker as told by your provider. Home safety Attach secure railings on both sides of your stairs. Install grab bars for your bathtub, shower, and toilet. Use a non-skid mat in your bathtub or shower. Attach bath mats securely with double-sided, non-slip  rug tape. Use good lighting in all rooms. Keep a flashlight near your bed. Make sure there is a clear path from your bed to the bathroom. Use night-lights. Do not use throw rugs. Make sure all carpeting is taped or tacked down securely. Remove all clutter from walkways and stairways, including extension cords. Repair uneven or broken steps and floors. Avoid walking on icy or slippery surfaces. Walk on the grass instead of on icy or slick sidewalks. Use ice melter to get rid of ice on walkways in the winter. Use a cordless phone. Questions to ask your health care provider Can you help me check my risk for a fall? Do any of my medicines make me more likely to fall? Should I take a vitamin D supplement? What exercises can I do to improve my strength and balance? Should I make an appointment to have my vision checked? Do I need a bone density test to check for weak bones (osteoporosis)? Would it help to use a cane or a walker? Where to  find more information Centers for Disease Control and Prevention, STEADI: TonerPromos.no Community-Based Fall Prevention Programs: TonerPromos.no [employer] on Aging: BaseRingTones.pl Contact a health care provider if: You fall at home. You are afraid of falling at home. You feel weak, drowsy, or dizzy. This information is not intended to replace advice given to you by your health care provider. Make sure you discuss any questions you have with your health care provider. Document Revised: 09/17/2021 Document Reviewed: 09/17/2021 Elsevier Patient Education  2024 Elsevier Inc. Steps to Quit Smoking Smoking tobacco is the leading cause of preventable death. It can affect almost every organ in the body. Smoking puts you and people around you at risk for many serious, long-lasting (chronic) diseases. Quitting smoking can be hard, but it is one of the best things that you can do for your health. It is never too late to quit. Do not give up if you cannot quit the first time.  Some people need to try many times to quit. Do your best to stick to your quit plan, and talk with your doctor if you have any questions or concerns. How do I get ready to quit? Pick a date to quit. Set a date within the next 2 weeks to give you time to prepare. Write down the reasons why you are quitting. Keep this list in places where you will see it often. Tell your family, friends, and co-workers that you are quitting. Their support is important. Talk with your doctor about the choices that may help you quit. Find out if your health insurance will pay for these treatments. Know the people, places, things, and activities that make you want to smoke (triggers). Avoid them. What first steps can I take to quit smoking? Throw away all cigarettes at home, at work, and in your car. Throw away the things that you use when you smoke, such as ashtrays and lighters. Clean your car. Empty the ashtray. Clean your home, including curtains and carpets. What can I do to help me quit smoking? Talk with your doctor about taking medicines and seeing a counselor. You are more likely to succeed when you do both. If you are pregnant or breastfeeding: Talk with your doctor about counseling or other ways to quit smoking. Do not take medicine to help you quit smoking unless your doctor tells you to. Quit right away Quit smoking completely, instead of slowly cutting back on how much you smoke over a period of time. Stopping smoking right away may be more successful than slowly quitting. Go to counseling. In-person is best if this is an option. You are more likely to quit if you go to counseling sessions regularly. Take medicine You may take medicines to help you quit. Some medicines need a prescription, and some you can buy over-the-counter. Some medicines may contain a drug called nicotine to replace the nicotine in cigarettes. Medicines may: Help you stop having the desire to smoke (cravings). Help to stop the  problems that come when you stop smoking (withdrawal symptoms). Your doctor may ask you to use: Nicotine patches, gum, or lozenges. Nicotine inhalers or sprays. Non-nicotine medicine that you take by mouth. Find resources Find resources and other ways to help you quit smoking and remain smoke-free after you quit. They include: Online chats with a Veterinary surgeon. Phone quitlines. Printed Materials engineer. Support groups or group counseling. Text messaging programs. Mobile phone apps. Use apps on your mobile phone or tablet that can help you stick to your quit plan. Examples  of free services include Quit Guide from the [employer] and smokefree.gov  What can I do to make it easier to quit?  Talk to your family and friends. Ask them to support and encourage you. Call a phone quitline, such as 1-800-QUIT-NOW, reach out to support groups, or work with a Veterinary surgeon. Ask people who smoke to not smoke around you. Avoid places that make you want to smoke, such as: Bars. Parties. Smoke-break areas at work. Spend time with people who do not smoke. Lower the stress in your life. Stress can make you want to smoke. Try these things to lower stress: Getting regular exercise. Doing deep-breathing exercises. Doing yoga. Meditating. What benefits will I see if I quit smoking? Over time, you may have: A better sense of smell and taste. Less coughing and sore throat. A slower heart rate. Lower blood pressure. Clearer skin. Better breathing. Fewer sick days. Summary Quitting smoking can be hard, but it is one of the best things that you can do for your health. Do not give up if you cannot quit the first time. Some people need to try many times to quit. When you decide to quit smoking, make a plan to help you succeed. Quit smoking right away, not slowly over a period of time. When you start quitting, get help and support to keep you smoke-free. This information is not intended to replace advice given to  you by your health care provider. Make sure you discuss any questions you have with your health care provider. Document Revised: 01/05/2021 Document Reviewed: 01/05/2021 Elsevier Patient Education  2024 [employer]. Exercising to Stay Healthy To become healthy and stay healthy, it is recommended that you do moderate-intensity and vigorous-intensity exercise. You can tell that you are exercising at a moderate intensity if your heart starts beating faster and you start breathing faster but can still hold a conversation. You can tell that you are exercising at a vigorous intensity if you are breathing much harder and faster and cannot hold a conversation while exercising. How can exercise benefit me? Exercising regularly is important. It has many health benefits, such as: Improving overall fitness, flexibility, and endurance. Increasing bone density. Helping with weight control. Decreasing body fat. Increasing muscle strength and endurance. Reducing stress and tension, anxiety, depression, or anger. Improving overall health. What guidelines should I follow while exercising? Before you start a new exercise program, talk with your health care provider. Do not exercise so much that you hurt yourself, feel dizzy, or get very short of breath. Wear comfortable clothes and wear shoes with good support. Drink plenty of water while you exercise to prevent dehydration or heat stroke. Work out until your breathing and your heartbeat get faster (moderate intensity). How often should I exercise? Choose an activity that you enjoy, and set realistic goals. Your health care provider can help you make an activity plan that is individually designed and works best for you. Exercise regularly as told by your health care provider. This may include: Doing strength training two times a week, such as: Lifting weights. Using resistance bands. Push-ups. Sit-ups. Yoga. Doing a certain intensity of exercise for a  given amount of time. Choose from these options: A total of 150 minutes of moderate-intensity exercise every week. A total of 75 minutes of vigorous-intensity exercise every week. A mix of moderate-intensity and vigorous-intensity exercise every week. Children, pregnant women, people who have not exercised regularly, people who are overweight, and older adults may need to talk with a health  care provider about what activities are safe to perform. If you have a medical condition, be sure to talk with your health care provider before you start a new exercise program. What are some exercise ideas? Moderate-intensity exercise ideas include: Walking 1 mile (1.6 km) in about 15 minutes. Biking. Hiking. Golfing. Dancing. Water aerobics. Vigorous-intensity exercise ideas include: Walking 4.5 miles (7.2 km) or more in about 1 hour. Jogging or running 5 miles (8 km) in about 1 hour. Biking 10 miles (16.1 km) or more in about 1 hour. Lap swimming. Roller-skating or in-line skating. Cross-country skiing. Vigorous competitive sports, such as football, basketball, and soccer. Jumping rope. Aerobic dancing. What are some everyday activities that can help me get exercise? Yard work, such as: Child psychotherapist. Raking and bagging leaves. Washing your car. Pushing a stroller. Shoveling snow. Gardening. Washing windows or floors. How can I be more active in my day-to-day activities? Use stairs instead of an elevator. Take a walk during your lunch break. If you drive, park your car farther away from your work or school. If you take public transportation, get off one stop early and walk the rest of the way. Stand up or walk around during all of your indoor phone calls. Get up, stretch, and walk around every 30 minutes throughout the day. Enjoy exercise with a friend. Support to continue exercising will help you keep a regular routine of activity. Where to find more information You can find  more information about exercising to stay healthy from: U.S. Department of Health and Human Services: ThisPath.fi Centers for Disease Control and Prevention (CDC): FootballExhibition.com.br Summary Exercising regularly is important. It will improve your overall fitness, flexibility, and endurance. Regular exercise will also improve your overall health. It can help you control your weight, reduce stress, and improve your bone density. Do not exercise so much that you hurt yourself, feel dizzy, or get very short of breath. Before you start a new exercise program, talk with your health care provider. This information is not intended to replace advice given to you by your health care provider. Make sure you discuss any questions you have with your health care provider. Document Revised: 05/12/2020 Document Reviewed: 05/12/2020 Elsevier Patient Education  2024 [employer].

## 2022-11-12 ENCOUNTER — Telehealth: Payer: Self-pay | Admitting: Family Medicine

## 2022-11-12 NOTE — Telephone Encounter (Signed)
Lmtrc

## 2022-11-12 NOTE — Telephone Encounter (Signed)
Patient called in requesting call back has questions regarding medications

## 2022-11-14 NOTE — Telephone Encounter (Signed)
Patient returning call call back # [phone].

## 2022-11-19 ENCOUNTER — Telehealth: Payer: Self-pay | Admitting: Family Medicine

## 2022-11-19 NOTE — Telephone Encounter (Signed)
Patient called asking for 90 day supply   Prescription Request  11/19/2022  LOV: 09/27/2022  What is the name of the medication or equipment? citalopram (CELEXA) 20 MG tablet [[id number]]   Have you contacted your pharmacy to request a refill? Yes   Which pharmacy would you like this sent to?   [facility]   Patient notified that their request is being sent to the clinical staff for review and that they should receive a response within 2 business days.   Please advise at Mobile [phone] (mobile)

## 2022-11-20 ENCOUNTER — Other Ambulatory Visit: Payer: Self-pay

## 2022-11-20 DIAGNOSIS — N951 Menopausal and female climacteric states: Secondary | ICD-10-CM

## 2022-11-20 MED ORDER — CITALOPRAM HYDROBROMIDE 20 MG PO TABS: 20.0000 mg | ORAL_TABLET | Freq: Every day | ORAL | 0 refills | Status: DC

## 2022-11-20 NOTE — Telephone Encounter (Signed)
Rx has been sent  

## 2022-12-03 ENCOUNTER — Ambulatory Visit: Admitting: Family Medicine

## 2023-01-20 DIAGNOSIS — E038 Other specified hypothyroidism: Secondary | ICD-10-CM | POA: Diagnosis not present

## 2023-01-20 DIAGNOSIS — E559 Vitamin D deficiency, unspecified: Secondary | ICD-10-CM | POA: Diagnosis not present

## 2023-01-20 DIAGNOSIS — R7301 Impaired fasting glucose: Secondary | ICD-10-CM | POA: Diagnosis not present

## 2023-01-20 DIAGNOSIS — E7849 Other hyperlipidemia: Secondary | ICD-10-CM | POA: Diagnosis not present

## 2023-01-21 LAB — CMP14+EGFR
ALT: 14 [IU]/L (ref 0–32)
AST: 17 [IU]/L (ref 0–40)
Albumin: 4.1 g/dL (ref 3.9–4.9)
Alkaline Phosphatase: 69 [IU]/L (ref 44–121)
BUN/Creatinine Ratio: 11 — ABNORMAL LOW (ref 12–28)
BUN: 13 mg/dL (ref 8–27)
Bilirubin Total: 0.3 mg/dL (ref 0.0–1.2)
CO2: 24 mmol/L (ref 20–29)
Calcium: 9.4 mg/dL (ref 8.7–10.3)
Chloride: 102 mmol/L (ref 96–106)
Creatinine, Ser: 1.15 mg/dL — ABNORMAL HIGH (ref 0.57–1.00)
Globulin, Total: 2 g/dL (ref 1.5–4.5)
Glucose: 101 mg/dL — ABNORMAL HIGH (ref 70–99)
Potassium: 4.4 mmol/L (ref 3.5–5.2)
Sodium: 140 mmol/L (ref 134–144)
Total Protein: 6.1 g/dL (ref 6.0–8.5)
eGFR: 53 mL/min/{1.73_m2} — ABNORMAL LOW (ref >59)

## 2023-01-21 LAB — CBC WITH DIFFERENTIAL/PLATELET
Basophils Absolute: 0.1 10*3/uL (ref 0.0–0.2)
Basos: 1 %
EOS (ABSOLUTE): 0.3 10*3/uL (ref 0.0–0.4)
Eos: 7 %
Hematocrit: 38.6 % (ref 34.0–46.6)
Hemoglobin: 12.9 g/dL (ref 11.1–15.9)
Immature Grans (Abs): 0 10*3/uL (ref 0.0–0.1)
Immature Granulocytes: 0 %
Lymphocytes Absolute: 1.3 10*3/uL (ref 0.7–3.1)
Lymphs: 27 %
MCH: 28.9 pg (ref 26.6–33.0)
MCHC: 33.4 g/dL (ref 31.5–35.7)
MCV: 86 fL (ref 79–97)
Monocytes Absolute: 0.5 10*3/uL (ref 0.1–0.9)
Monocytes: 10 %
Neutrophils Absolute: 2.7 10*3/uL (ref 1.4–7.0)
Neutrophils: 55 %
Platelets: 268 10*3/uL (ref 150–450)
RBC: 4.47 x10E6/uL (ref 3.77–5.28)
RDW: 12.8 % (ref 11.7–15.4)
WBC: 4.8 10*3/uL (ref 3.4–10.8)

## 2023-01-21 LAB — LIPID PANEL
Chol/HDL Ratio: 2.4 {ratio} (ref 0.0–4.4)
Cholesterol, Total: 191 mg/dL (ref 100–199)
HDL: 78 mg/dL (ref >39)
LDL Chol Calc (NIH): 98 mg/dL (ref 0–99)
Triglycerides: 84 mg/dL (ref 0–149)
VLDL Cholesterol Cal: 15 mg/dL (ref 5–40)

## 2023-01-21 LAB — TSH+FREE T4
Free T4: 1.36 ng/dL (ref 0.82–1.77)
TSH: 4.37 u[IU]/mL (ref 0.450–4.500)

## 2023-01-21 LAB — VITAMIN D 25 HYDROXY (VIT D DEFICIENCY, FRACTURES): Vit D, 25-Hydroxy: 51.5 ng/mL (ref 30.0–100.0)

## 2023-01-21 LAB — HEMOGLOBIN A1C
Est. average glucose Bld gHb Est-mCnc: 123 mg/dL
Hgb A1c MFr Bld: 5.9 % — ABNORMAL HIGH (ref 4.8–5.6)

## 2023-01-25 NOTE — Progress Notes (Signed)
Your labs indicate that you are prediabetic, and I recommend reducing your intake of high-sugar foods and beverages to help manage your blood sugar levels. Additionally, I encourage you to increase your water intake to at least 64 ounces daily. All other lab results are stable.

## 2023-01-27 ENCOUNTER — Ambulatory Visit (INDEPENDENT_AMBULATORY_CARE_PROVIDER_SITE_OTHER): Payer: 59 | Admitting: Family Medicine

## 2023-01-27 ENCOUNTER — Encounter: Payer: Self-pay | Admitting: Family Medicine

## 2023-01-27 VITALS — BP 120/71 | HR 90 | Ht <= 58 in | Wt 189.1 lb

## 2023-01-27 DIAGNOSIS — G479 Sleep disorder, unspecified: Secondary | ICD-10-CM

## 2023-01-27 DIAGNOSIS — G629 Polyneuropathy, unspecified: Secondary | ICD-10-CM | POA: Diagnosis not present

## 2023-01-27 DIAGNOSIS — E7849 Other hyperlipidemia: Secondary | ICD-10-CM | POA: Diagnosis not present

## 2023-01-27 DIAGNOSIS — N951 Menopausal and female climacteric states: Secondary | ICD-10-CM

## 2023-01-27 DIAGNOSIS — R7303 Prediabetes: Secondary | ICD-10-CM

## 2023-01-27 DIAGNOSIS — I10 Essential (primary) hypertension: Secondary | ICD-10-CM | POA: Diagnosis not present

## 2023-01-27 DIAGNOSIS — R234 Changes in skin texture: Secondary | ICD-10-CM | POA: Diagnosis not present

## 2023-01-27 MED ORDER — PRAVASTATIN SODIUM 40 MG PO TABS: 40.0000 mg | ORAL_TABLET | Freq: Every day | ORAL | 3 refills | Status: AC

## 2023-01-27 MED ORDER — OLMESARTAN-AMLODIPINE-HCTZ 40-10-12.5 MG PO TABS: 1.0000 | ORAL_TABLET | Freq: Every day | ORAL | 1 refills | Status: DC

## 2023-01-27 MED ORDER — VITAMIN B-6 50 MG PO TABS: 50.0000 mg | ORAL_TABLET | Freq: Every day | ORAL | 1 refills | Status: AC

## 2023-01-27 MED ORDER — HYDROXYZINE PAMOATE 25 MG PO CAPS: 25.0000 mg | ORAL_CAPSULE | Freq: Every evening | ORAL | 0 refills | Status: AC

## 2023-01-27 MED ORDER — CITALOPRAM HYDROBROMIDE 20 MG PO TABS
20.0000 mg | ORAL_TABLET | Freq: Every day | ORAL | 0 refills | Status: DC
Start: 2023-01-27 — End: 2023-02-06

## 2023-01-27 MED ORDER — TRIAMCINOLONE ACETONIDE 0.1 % EX CREA: 1.0000 | TOPICAL_CREAM | Freq: Two times a day (BID) | CUTANEOUS | 0 refills | Status: AC

## 2023-01-27 NOTE — Progress Notes (Signed)
Established Patient Office Visit  Subjective:  Patient ID: Victoria Graves, female    DOB: 01-Sep-1955  Age: 67 y.o. MRN: [id number]  CC:  Chief Complaint  Patient presents with   Care Management    4 month f/u, states cramping in her hands and arms have not improved, bought otc supplemant hasn't helped. States her feet are better, has sleeping difficulty.    Medication Refill    Needs refills.    HPI Victoria Graves is a 67 y.o. female with past medical history of hypertension, depression, hyperlipidemia, and prediabetes presents for f/u of  chronic medical conditions. For the details of today's visit, please refer to the assessment and plan.      Past Medical History:  Diagnosis Date   Allergy    Anxiety    Arthritis    CKD (chronic kidney disease) stage 3, GFR 30-59 ml/min (HCC) 05/08/2021   Depression    GERD (gastroesophageal reflux disease)    Hyperlipidemia    Hypertension    Neuropathy 01/28/2023    Past Surgical History:  Procedure Laterality Date   ABDOMINAL HYSTERECTOMY  1999   fibroid   COLONOSCOPY N/A 08/21/2016   Procedure: COLONOSCOPY;  Surgeon: West Bali, MD;  Location: [facility];  Service: Endoscopy;  Laterality: N/A;  9:30 AM   COLONOSCOPY WITH PROPOFOL N/A 10/23/2021   Procedure: COLONOSCOPY WITH PROPOFOL;  Surgeon: Lanelle Bal, DO;  Location: [facility];  Service: Endoscopy;  Laterality: N/A;  8:45 AM  ASA 2   POLYPECTOMY  08/21/2016   Procedure: POLYPECTOMY;  Surgeon: West Bali, MD;  Location: [facility];  Service: Endoscopy;;  ascending colon and sigmoid x2   POLYPECTOMY  10/23/2021   Procedure: POLYPECTOMY;  Surgeon: Lanelle Bal, DO;  Location: [facility];  Service: Endoscopy;;    Family History  Problem Relation Age of Onset   Hypertension Mother    Arthritis Mother    Stomach cancer Mother    Heart disease Maternal Grandmother    Kidney disease Maternal Grandmother    Breast cancer Neg Hx    Cancer -  Lung Neg Hx     Social History   Socioeconomic History   Marital status: Divorced    Spouse name: Not on file   Number of children: 0   Years of education: 10   Highest education level: Not on file  Occupational History   Occupation: disability    Comment: back and hip  Tobacco Use   Smoking status: Every Day    Current packs/day: 0.10    Average packs/day: 0.1 packs/day for 20.0 years (2.0 ttl pk-yrs)    Types: Cigarettes   Smokeless tobacco: Never   Tobacco comments:    Smokes a pack a week. Smoking since age 54.   Vaping Use   Vaping status: Never Used  Substance and Sexual Activity   Alcohol use: Yes    Comment: occasional   Drug use: No   Sexual activity: Yes    Birth control/protection: Surgical    Comment: total hystetrectomy  Other Topics Concern   Not on file  Social History Narrative   Lives with friend and cousin who has mental disability   Social Drivers of Corporate investment banker Strain: Low Risk  (11/06/2022)   Overall Financial Resource Strain (CARDIA)    Difficulty of Paying Living Expenses: Not hard at all  Food Insecurity: No Food Insecurity (11/06/2022)   Hunger Vital Sign  Worried About Programme researcher, broadcasting/film/video in the Last Year: Never true    Ran Out of Food in the Last Year: Never true  Transportation Needs: No Transportation Needs (11/06/2022)   PRAPARE - Administrator, Civil Service (Medical): No    Lack of Transportation (Non-Medical): No  Physical Activity: Inactive (11/06/2022)   Exercise Vital Sign    Days of Exercise per Week: 0 days    Minutes of Exercise per Session: 0 min  Stress: No Stress Concern Present (11/06/2022)   [employer] of Occupational Health - Occupational Stress Questionnaire    Feeling of Stress : Not at all  Social Connections: Moderately Isolated (11/06/2022)   Social Connection and Isolation Panel [NHANES]    Frequency of Communication with Friends and Family: More than three times a week     Frequency of Social Gatherings with Friends and Family: More than three times a week    Attends Religious Services: 1 to 4 times per year    Active Member of [employer] or Organizations: No    Attends Banker Meetings: Never    Marital Status: Divorced  Catering manager Violence: Not At Risk (11/06/2022)   Humiliation, Afraid, Rape, and Kick questionnaire    Fear of Current or Ex-Partner: No    Emotionally Abused: No    Physically Abused: No    Sexually Abused: No    Outpatient Medications Prior to Visit  Medication Sig Dispense Refill   acetaminophen (TYLENOL) 500 MG tablet Take 1,000 mg by mouth daily.     aspirin EC 81 MG tablet Take 81 mg by mouth daily.     Biotin w/ Vitamins C & E (HAIR/SKIN/NAILS PO) Take 2 tablets by mouth daily. 2.500 mcg     buPROPion (WELLBUTRIN XL) 150 MG 24 hr tablet TAKE 1 TABLET BY MOUTH DAILY 90 tablet 3   CALCIUM PO Take 600 mg by mouth in the morning.     Cholecalciferol (VITAMIN D) 2000 units CAPS Take 2,000 Units by mouth in the morning.     Cyanocobalamin (VITAMIN B 12 PO) Take 5,000 Units by mouth daily. Once a day     cycloSPORINE (RESTASIS) 0.05 % ophthalmic emulsion Place 2 drops into both eyes daily.     docusate sodium (COLACE) 100 MG capsule Take 100 mg by mouth daily.     fluticasone (FLONASE) 50 MCG/ACT nasal spray Place 2 sprays into both nostrils daily as needed for allergies.     Metamucil Fiber CHEW Chew 15 mg by mouth daily. 3 gummies 5 mg each     Multiple Vitamins-Minerals (WOMENS MULTI GUMMIES PO) Take 2 tablets by mouth daily at 2 am. Vitafusion 2 gummies     naproxen (NAPROSYN) 500 MG tablet Take 500 mg by mouth daily as needed for mild pain or moderate pain.     citalopram (CELEXA) 20 MG tablet Take 1 tablet (20 mg total) by mouth daily. 90 tablet 0   Olmesartan-amLODIPine-HCTZ 40-10-12.5 MG TABS Take 1 tablet by mouth daily. 90 tablet 1   pravastatin (PRAVACHOL) 40 MG tablet Take 1 tablet (40 mg total) by mouth daily. 90  tablet 3   triamcinolone cream (KENALOG) 0.1 % Apply 1 Application topically 2 (two) times daily. 45 g 0   No facility-administered medications prior to visit.    Allergies  Allergen Reactions   Lisinopril Cough   Penicillins Rash    Has patient had a PCN reaction causing immediate rash, facial/tongue/throat swelling, SOB or lightheadedness  with hypotension: Yes Has patient had a PCN reaction causing severe rash involving mucus membranes or skin necrosis: No Has patient had a PCN reaction that required hospitalization: No Has patient had a PCN reaction occurring within the last 10 years: No If all of the above answers are "NO", then may proceed with Cephalosporin use.     ROS Review of Systems  Constitutional:  Negative for chills and fever.  Eyes:  Negative for visual disturbance.  Respiratory:  Negative for chest tightness and shortness of breath.   Neurological:  Positive for numbness (hands). Negative for dizziness and headaches.  Psychiatric/Behavioral:  Positive for sleep disturbance.       Objective:    Physical Exam HENT:     Head: Normocephalic.     Mouth/Throat:     Mouth: Mucous membranes are moist.  Cardiovascular:     Rate and Rhythm: Normal rate.     Heart sounds: Normal heart sounds.  Pulmonary:     Effort: Pulmonary effort is normal.     Breath sounds: Normal breath sounds.  Neurological:     Mental Status: She is alert.     BP 120/71   Pulse 90   Ht 4\' 8"  (1.422 m)   Wt 189 lb 1.9 oz (85.8 kg)   SpO2 96%   BMI 42.40 kg/m  Wt Readings from Last 3 Encounters:  01/27/23 189 lb 1.9 oz (85.8 kg)  11/06/22 181 lb (82.1 kg)  09/27/22 183 lb 1.3 oz (83 kg)    Lab Results  Component Value Date   TSH 4.370 01/20/2023   Lab Results  Component Value Date   WBC 4.8 01/20/2023   HGB 12.9 01/20/2023   HCT 38.6 01/20/2023   MCV 86 01/20/2023   PLT 268 01/20/2023   Lab Results  Component Value Date   NA 140 01/20/2023   K 4.4 01/20/2023   CO2  24 01/20/2023   GLUCOSE 101 (H) 01/20/2023   BUN 13 01/20/2023   CREATININE 1.15 (H) 01/20/2023   BILITOT 0.3 01/20/2023   ALKPHOS 69 01/20/2023   AST 17 01/20/2023   ALT 14 01/20/2023   PROT 6.1 01/20/2023   ALBUMIN 4.1 01/20/2023   CALCIUM 9.4 01/20/2023   ANIONGAP 7 10/15/2021   EGFR 53 (L) 01/20/2023   Lab Results  Component Value Date   CHOL 191 01/20/2023   Lab Results  Component Value Date   HDL 78 01/20/2023   Lab Results  Component Value Date   LDLCALC 98 01/20/2023   Lab Results  Component Value Date   TRIG 84 01/20/2023   Lab Results  Component Value Date   CHOLHDL 2.4 01/20/2023   Lab Results  Component Value Date   HGBA1C 5.9 (H) 01/20/2023      Assessment & Plan:  Primary hypertension Assessment & Plan: The patient's condition is controlled. She is encouraged to continue taking olmesartan-amlodipine-hydrochlorothiazide 40-10-12.5 mg daily. A low-sodium diet with increased physical activity is recommended. No changes to the medication regimen are needed today. BP Readings from Last 3 Encounters:  01/27/23 120/71  09/27/22 111/68  06/27/22 102/68     Orders: -     Olmesartan-amLODIPine-HCTZ; Take 1 tablet by mouth daily.  Dispense: 90 tablet; Refill: 1  Other hyperlipidemia Assessment & Plan: The patient takes pravastatin 40 mg daily. She is encouraged to reduce her intake of cholesterol, trans fats, and saturated fats while increasing her physical activity and consumption of fruits, vegetables, and whole grains. She denies any abdominal  pain, nausea, or vomiting, and reports no muscle aches or pain. Lab Results  Component Value Date   CHOL 191 01/20/2023   HDL 78 01/20/2023   LDLCALC 98 01/20/2023   TRIG 84 01/20/2023   CHOLHDL 2.4 01/20/2023     Orders: -     Pravastatin Sodium; Take 1 tablet (40 mg total) by mouth daily.  Dispense: 90 tablet; Refill: 3  Sleep disturbance Assessment & Plan: The patient complains of difficulty falling  and staying asleep and has not taken any over-the-counter medication to address her concerns. Nonpharmacological interventions were discussed, including turning off electronic devices an hour before bedtime, avoiding stimulants such as caffeine, exercising earlier in the day, using the bed only for sleep and intimacy, and refraining from staying in bed if unable to sleep within 15 minutes. She was advised to engage in a relaxing activity until feeling sleepy.  Therapy will be initiated today with hydroxyzine 25 mg as needed at bedtime to aid sleep. The patient verbalized understanding and is aware of the plan of care.   Orders: -     hydrOXYzine Pamoate; Take 1 capsule (25 mg total) by mouth at bedtime.  Dispense: 30 capsule; Refill: 0  Neuropathy Assessment & Plan: The patient complains of numbness and tingling in both hands, which often radiates to her right elbow. She reports that her symptoms are worse at nighttime and occasionally occur during the day, causing her hands to go numb. The only supplement she takes over-the-counter is vitamin D.  She denies any recent injury or trauma to the hands or elbows and reports no difficulty with activities of daily living (ADLs) or reduced use of the affected hands. Therapy will be initiated with pyridoxine 50 mg daily to address her symptoms. The patient verbalized understanding and is aware of the plan of care.    Orders: -     Vitamin B-6; Take 1 tablet (50 mg total) by mouth daily.  Dispense: 30 tablet; Refill: 1  Prediabetes Assessment & Plan: Lifestyle modifications for prediabetes were discussed, including adopting a heart-healthy diet and increasing physical activity. The patient was encouraged to decrease her intake of high-sugar foods and beverages. She verbalized understanding and is aware of the plan of care.  Lab Results  Component Value Date   HGBA1C 5.9 (H) 01/20/2023      Menopausal vasomotor syndrome Assessment & Plan: Refill  sent  Orders: -     Citalopram Hydrobromide; Take 1 tablet (20 mg total) by mouth daily.  Dispense: 90 tablet; Refill: 0  Peeling skin Assessment & Plan: Refill sent  Orders: -     Triamcinolone Acetonide; Apply 1 Application topically 2 (two) times daily.  Dispense: 45 g; Refill: 0  Note: This chart has been completed using Engineer, civil (consulting) software, and while attempts have been made to ensure accuracy, certain words and phrases may not be transcribed as intended.    Follow-up: Return in about 3 months (around 04/27/2023).   Gilmore Laroche, FNP

## 2023-01-27 NOTE — Patient Instructions (Addendum)
PLEASE SCHEDULE A BONE DENSITY.   I appreciate the opportunity to provide care to you today!    Follow up:  3 months  Neuropathy Start talking pyridoxine 50 mg daily  Sleep disturbance -start taking hydroxyzine 25 mg at bedtime   Non-Pharmacological Management for Sleep Hygiene:  Establish a Consistent Bedtime Routine: -Develop and adhere to a regular sleep and wake schedule. -Avoid using electronic devices, including computers and smartphones, at least one hour before bedtime. -If unable to fall asleep within 15 minutes, refrain from staying in bed and engage in a relaxing activity until you feel sleepy. Reduce Daily Stress: -Engage in stress-reducing activities before bedtime to help relax your mind and body. Avoid intense physical exercise and stimulant use, such as caffeine, late in the day. Optimize Sleep Environment: -Use the bed and bedroom exclusively for sleep and intimate activities. -Consider removing electronic devices from the sleeping area and limit screen time prior to bedtime. Incorporate Relaxation Techniques: -Practice abdominal breathing and meditation to promote relaxation. Utilize progressive muscle relaxation and visualization techniques to aid in achieving restful sleep.     Please continue to a heart-healthy diet and increase your physical activities. Try to exercise for at least five days a week.    It was a pleasure to see you and I look forward to continuing to work together on your health and well-being. Please do not hesitate to call the office if you need care or have questions about your care.  In case of emergency, please visit the Emergency Department for urgent care, or contact our clinic at [phone] to schedule an appointment. We're here to help you!   Have a wonderful day and week. With Gratitude, Gilmore Laroche MSN, FNP-BC

## 2023-01-28 ENCOUNTER — Telehealth: Payer: Self-pay

## 2023-01-28 ENCOUNTER — Encounter: Payer: Self-pay | Admitting: Family Medicine

## 2023-01-28 DIAGNOSIS — G629 Polyneuropathy, unspecified: Secondary | ICD-10-CM | POA: Insufficient documentation

## 2023-01-28 DIAGNOSIS — G479 Sleep disorder, unspecified: Secondary | ICD-10-CM | POA: Insufficient documentation

## 2023-01-28 HISTORY — DX: Polyneuropathy, unspecified: G62.9

## 2023-01-28 NOTE — Assessment & Plan Note (Signed)
Refill sent.

## 2023-01-28 NOTE — Assessment & Plan Note (Signed)
 Lifestyle modifications for prediabetes were discussed, including adopting a heart-healthy diet and increasing physical activity. The patient was encouraged to decrease her intake of high-sugar foods and beverages. She verbalized understanding and is aware of the plan of care.  Lab Results  Component Value Date   HGBA1C 5.9 (H) 01/20/2023

## 2023-01-28 NOTE — Assessment & Plan Note (Signed)
 The patient takes pravastatin  40 mg daily. She is encouraged to reduce her intake of cholesterol, trans fats, and saturated fats while increasing her physical activity and consumption of fruits, vegetables, and whole grains. She denies any abdominal pain, nausea, or vomiting, and reports no muscle aches or pain. Lab Results  Component Value Date   CHOL 191 01/20/2023   HDL 78 01/20/2023   LDLCALC 98 01/20/2023   TRIG 84 01/20/2023   CHOLHDL 2.4 01/20/2023

## 2023-01-28 NOTE — Telephone Encounter (Signed)
Spoke to pt regarding labs

## 2023-01-28 NOTE — Assessment & Plan Note (Signed)
 The patient complains of difficulty falling and staying asleep and has not taken any over-the-counter medication to address her concerns. Nonpharmacological interventions were discussed, including turning off electronic devices an hour before bedtime, avoiding stimulants such as caffeine, exercising earlier in the day, using the bed only for sleep and intimacy, and refraining from staying in bed if unable to sleep within 15 minutes. She was advised to engage in a relaxing activity until feeling sleepy.  Therapy will be initiated today with hydroxyzine  25 mg as needed at bedtime to aid sleep. The patient verbalized understanding and is aware of the plan of care.

## 2023-01-28 NOTE — Assessment & Plan Note (Signed)
 The patient complains of numbness and tingling in both hands, which often radiates to her right elbow. She reports that her symptoms are worse at nighttime and occasionally occur during the day, causing her hands to go numb. The only supplement she takes over-the-counter is vitamin D .  She denies any recent injury or trauma to the hands or elbows and reports no difficulty with activities of daily living (ADLs) or reduced use of the affected hands. Therapy will be initiated with pyridoxine 50 mg daily to address her symptoms. The patient verbalized understanding and is aware of the plan of care.

## 2023-01-28 NOTE — Assessment & Plan Note (Signed)
 The patient's condition is controlled. She is encouraged to continue taking olmesartan -amlodipine -hydrochlorothiazide 40-10-12.5 mg daily. A low-sodium diet with increased physical activity is recommended. No changes to the medication regimen are needed today. BP Readings from Last 3 Encounters:  01/27/23 120/71  09/27/22 111/68  06/27/22 102/68

## 2023-01-28 NOTE — Telephone Encounter (Signed)
 Copied from CRM [id number]. Topic: General - Other >> Jan 28, 2023  8:44 AM Carlatta H wrote: Reason for CRM: Patient received a call from the office//Please call back

## 2023-02-06 ENCOUNTER — Other Ambulatory Visit: Payer: Self-pay | Admitting: Family Medicine

## 2023-02-06 ENCOUNTER — Ambulatory Visit (HOSPITAL_COMMUNITY)
Admission: RE | Admit: 2023-02-06 | Discharge: 2023-02-06 | Disposition: A | Discharge status: Home / Self Care | Payer: 59 | Source: Ambulatory Visit | Attending: Family Medicine | Admitting: Family Medicine

## 2023-02-06 DIAGNOSIS — N951 Menopausal and female climacteric states: Secondary | ICD-10-CM

## 2023-02-06 DIAGNOSIS — Z0000 Encounter for general adult medical examination without abnormal findings: Secondary | ICD-10-CM | POA: Insufficient documentation

## 2023-02-06 DIAGNOSIS — M85852 Other specified disorders of bone density and structure, left thigh: Secondary | ICD-10-CM | POA: Diagnosis not present

## 2023-02-06 DIAGNOSIS — Z780 Asymptomatic menopausal state: Secondary | ICD-10-CM | POA: Diagnosis not present

## 2023-02-06 NOTE — Telephone Encounter (Signed)
 Copied from CRM [id number]. Topic: Clinical - Medication Refill >> Feb 06, 2023  2:51 PM Ivette P wrote: Most Recent Primary Care Visit:  Provider: MAREN DAS  Department: [facility]  Visit Type: OFFICE VISIT  Date: 01/27/2023  Medication: buPROPion  (WELLBUTRIN  XL) 150 MG 24 hr tablet, citalopram  (CELEXA ) 20 MG tablet  Has the patient contacted their pharmacy? Yes (Agent: If no, request that the patient contact the pharmacy for the refill. If patient does not wish to contact the pharmacy document the reason why and proceed with request.) (Agent: If yes, when and what did the pharmacy advise?)  Is this the correct pharmacy for this prescription? Yes If no, delete pharmacy and type the correct one.  This is the patient's preferred pharmacy:  [facility] [address] [place] - [address] [place] [address] [place] [address] Phone: [phone] Fax: [phone]   Has the prescription been filled recently? No  Is the patient out of the medication? No  Has the patient been seen for an appointment in the last year OR does the patient have an upcoming appointment? Yes  Can we respond through MyChart? No  Agent: Please be advised that Rx refills may take up to 3 business days. We ask that you follow-up with your pharmacy.

## 2023-02-07 NOTE — Progress Notes (Signed)
 Please inform the patient that her dexa scan results shows osteopenia. This is another word for low bone mineral density or low bone mass. People with low bone mass generally have a lower risk of breaking a bone than people with osteoporosis. But their bone mineral density is below normal.i recommend a dietary intake of approximately 1200 mg daily calcium and ingest a total of 800 international units of vitamin D  daily. If there is inadequate dietary intake of calcium, i recommend taking supplemental elemental calcium (generally 500 to 1000 mg/day.

## 2023-02-10 MED ORDER — CITALOPRAM HYDROBROMIDE 20 MG PO TABS: 20.0000 mg | ORAL_TABLET | Freq: Every day | ORAL | 0 refills | Status: DC

## 2023-02-10 MED ORDER — BUPROPION HCL ER (XL) 150 MG PO TB24: 150.0000 mg | ORAL_TABLET | Freq: Every day | ORAL | 3 refills | Status: DC

## 2023-04-29 ENCOUNTER — Ambulatory Visit: Payer: 59 | Admitting: Family Medicine

## 2023-06-05 ENCOUNTER — Ambulatory Visit (INDEPENDENT_AMBULATORY_CARE_PROVIDER_SITE_OTHER): Admitting: Family Medicine

## 2023-06-05 ENCOUNTER — Encounter: Payer: Self-pay | Admitting: Family Medicine

## 2023-06-05 VITALS — BP 132/74 | HR 71 | Ht <= 58 in | Wt 186.0 lb

## 2023-06-05 DIAGNOSIS — K219 Gastro-esophageal reflux disease without esophagitis: Secondary | ICD-10-CM

## 2023-06-05 DIAGNOSIS — I10 Essential (primary) hypertension: Secondary | ICD-10-CM | POA: Diagnosis not present

## 2023-06-05 DIAGNOSIS — E559 Vitamin D deficiency, unspecified: Secondary | ICD-10-CM | POA: Diagnosis not present

## 2023-06-05 DIAGNOSIS — R7303 Prediabetes: Secondary | ICD-10-CM

## 2023-06-05 DIAGNOSIS — R7301 Impaired fasting glucose: Secondary | ICD-10-CM

## 2023-06-05 DIAGNOSIS — E7849 Other hyperlipidemia: Secondary | ICD-10-CM

## 2023-06-05 DIAGNOSIS — E038 Other specified hypothyroidism: Secondary | ICD-10-CM

## 2023-06-05 MED ORDER — PHENTERMINE HCL 15 MG PO CAPS
15.0000 mg | ORAL_CAPSULE | ORAL | 0 refills | Status: DC
Start: 2023-06-05 — End: 2023-11-06

## 2023-06-05 MED ORDER — OMEPRAZOLE 20 MG PO CPDR: 20.0000 mg | DELAYED_RELEASE_CAPSULE | Freq: Every day | ORAL | 3 refills | Status: DC

## 2023-06-05 NOTE — Progress Notes (Signed)
 Established Patient Office Visit  Subjective:  Patient ID: Victoria Graves, female    DOB: 07-02-1955  Age: 68 y.o. MRN: [id number]  CC:  Chief Complaint  Patient presents with   Medical Management of Chronic Issues    3 month f/u  Would like to discuss wt. Loss concerns     HPI Victoria Graves is a 68 y.o. female with past medical history of hypertension, neuropathy and prediabetes presents for f/u of  chronic medical conditions. For the details of today's visit, please refer to the assessment and plan.     Past Medical History:  Diagnosis Date   Allergy    Anxiety    Arthritis    CKD (chronic kidney disease) stage 3, GFR 30-59 ml/min (HCC) 05/08/2021   Depression    GERD (gastroesophageal reflux disease)    Hyperlipidemia    Hypertension    Neuropathy 01/28/2023    Past Surgical History:  Procedure Laterality Date   ABDOMINAL HYSTERECTOMY  1999   fibroid   COLONOSCOPY N/A 08/21/2016   Procedure: COLONOSCOPY;  Surgeon: Alyce Jubilee, MD;  Location: [facility];  Service: Endoscopy;  Laterality: N/A;  9:30 AM   COLONOSCOPY WITH PROPOFOL  N/A 10/23/2021   Procedure: COLONOSCOPY WITH PROPOFOL ;  Surgeon: Vinetta Greening, DO;  Location: [facility];  Service: Endoscopy;  Laterality: N/A;  8:45 AM  ASA 2   POLYPECTOMY  08/21/2016   Procedure: POLYPECTOMY;  Surgeon: Alyce Jubilee, MD;  Location: [facility];  Service: Endoscopy;;  ascending colon and sigmoid x2   POLYPECTOMY  10/23/2021   Procedure: POLYPECTOMY;  Surgeon: Vinetta Greening, DO;  Location: [facility];  Service: Endoscopy;;    Family History  Problem Relation Age of Onset   Hypertension Mother    Arthritis Mother    Stomach cancer Mother    Heart disease Maternal Grandmother    Kidney disease Maternal Grandmother    Breast cancer Neg Hx    Cancer - Lung Neg Hx     Social History   Socioeconomic History   Marital status: Divorced    Spouse name: Not on file   Number of children: 0    Years of education: 10   Highest education level: Not on file  Occupational History   Occupation: disability    Comment: back and hip  Tobacco Use   Smoking status: Every Day    Current packs/day: 0.10    Average packs/day: 0.1 packs/day for 20.0 years (2.0 ttl pk-yrs)    Types: Cigarettes   Smokeless tobacco: Never   Tobacco comments:    Smokes a pack a week. Smoking since age 89.   Vaping Use   Vaping status: Never Used  Substance and Sexual Activity   Alcohol use: Yes    Comment: occasional   Drug use: No   Sexual activity: Not Currently    Birth control/protection: Surgical    Comment: total hystetrectomy  Other Topics Concern   Not on file  Social History Narrative   Lives with friend and cousin who has mental disability   Social Drivers of Corporate investment banker Strain: Low Risk  (11/06/2022)   Overall Financial Resource Strain (CARDIA)    Difficulty of Paying Living Expenses: Not hard at all  Food Insecurity: No Food Insecurity (11/06/2022)   Hunger Vital Sign    Worried About Running Out of Food in the Last Year: Never true    Ran Out of Food in  the Last Year: Never true  Transportation Needs: No Transportation Needs (11/06/2022)   PRAPARE - Administrator, Civil Service (Medical): No    Lack of Transportation (Non-Medical): No  Physical Activity: Inactive (11/06/2022)   Exercise Vital Sign    Days of Exercise per Week: 0 days    Minutes of Exercise per Session: 0 min  Stress: No Stress Concern Present (11/06/2022)   [employer] of Occupational Health - Occupational Stress Questionnaire    Feeling of Stress : Not at all  Social Connections: Moderately Isolated (11/06/2022)   Social Connection and Isolation Panel [NHANES]    Frequency of Communication with Friends and Family: More than three times a week    Frequency of Social Gatherings with Friends and Family: More than three times a week    Attends Religious Services: 1 to 4 times per year     Active Member of [employer] or Organizations: No    Attends Banker Meetings: Never    Marital Status: Divorced  Catering manager Violence: Not At Risk (11/06/2022)   Humiliation, Afraid, Rape, and Kick questionnaire    Fear of Current or Ex-Partner: No    Emotionally Abused: No    Physically Abused: No    Sexually Abused: No    Outpatient Medications Prior to Visit  Medication Sig Dispense Refill   acetaminophen  (TYLENOL ) 500 MG tablet Take 1,000 mg by mouth daily.     aspirin EC 81 MG tablet Take 81 mg by mouth daily.     Biotin w/ Vitamins C & E (HAIR/SKIN/NAILS PO) Take 2 tablets by mouth daily. 2.500 mcg     buPROPion  (WELLBUTRIN  XL) 150 MG 24 hr tablet Take 1 tablet (150 mg total) by mouth daily. 90 tablet 3   CALCIUM PO Take 600 mg by mouth in the morning.     Cholecalciferol (VITAMIN D ) 2000 units CAPS Take 2,000 Units by mouth in the morning.     citalopram  (CELEXA ) 20 MG tablet Take 1 tablet (20 mg total) by mouth daily. 90 tablet 0   Cyanocobalamin (VITAMIN B 12 PO) Take 5,000 Units by mouth daily. Once a day     cycloSPORINE (RESTASIS) 0.05 % ophthalmic emulsion Place 2 drops into both eyes daily.     Fluoxetine  HCl, PMDD, 20 MG TABS Take 20 mg by mouth.     fluticasone (FLONASE) 50 MCG/ACT nasal spray Place 2 sprays into both nostrils daily as needed for allergies.     hydrOXYzine  (VISTARIL ) 25 MG capsule Take 1 capsule (25 mg total) by mouth at bedtime. 30 capsule 0   Metamucil Fiber CHEW Chew 15 mg by mouth daily. 3 gummies 5 mg each     Multiple Vitamins-Minerals (WOMENS MULTI GUMMIES PO) Take 2 tablets by mouth daily at 2 am. Vitafusion 2 gummies     naproxen  (NAPROSYN ) 500 MG tablet Take 500 mg by mouth daily as needed for mild pain or moderate pain.     naproxen  (NAPROSYN ) 500 MG tablet Take 500 mg by mouth 2 (two) times daily with a meal.     Olmesartan -amLODIPine -HCTZ 40-10-12.5 MG TABS Take 1 tablet by mouth daily. 90 tablet 1   pravastatin  (PRAVACHOL )  40 MG tablet Take 1 tablet (40 mg total) by mouth daily. 90 tablet 3   pyridOXINE (VITAMIN B6) 50 MG tablet Take 1 tablet (50 mg total) by mouth daily. 30 tablet 1   triamcinolone  cream (KENALOG ) 0.1 % Apply 1 Application topically 2 (two) times daily.  45 g 0   docusate sodium (COLACE) 100 MG capsule Take 100 mg by mouth daily. (Patient not taking: Reported on 06/05/2023)     No facility-administered medications prior to visit.    Allergies  Allergen Reactions   Lisinopril Cough   Penicillins Rash and Dermatitis    Has patient had a PCN reaction causing immediate rash, facial/tongue/throat swelling, SOB or lightheadedness with hypotension: Yes  Has patient had a PCN reaction causing severe rash involving mucus membranes or skin necrosis: No  Has patient had a PCN reaction that required hospitalization: No  Has patient had a PCN reaction occurring within the last 10 years: No  If all of the above answers are "NO", then may proceed with Cephalosporin use.  Has patient had a PCN reaction causing immediate rash, facial/tongue/throat swelling, SOB or lightheadedness with hypotension: Yes, Has patient had a PCN reaction causing severe rash involving mucus membranes or skin necrosis: No, Has patient had a PCN reaction that required hospitalization: No, Has patient had a PCN reaction occurring within the last 10 years: No, If all of the above answers are "NO", then may proceed with Cephalosporin use.    ROS Review of Systems  Constitutional:  Negative for chills and fever.  Eyes:  Negative for visual disturbance.  Respiratory:  Negative for chest tightness and shortness of breath.   Neurological:  Negative for dizziness and headaches.      Objective:     Physical Exam Constitutional:      Appearance: She is obese.  HENT:     Head: Normocephalic.     Mouth/Throat:     Mouth: Mucous membranes are moist.  Cardiovascular:     Rate and Rhythm: Normal rate.     Heart sounds: Normal  heart sounds.  Pulmonary:     Effort: Pulmonary effort is normal.     Breath sounds: Normal breath sounds.  Neurological:     Mental Status: She is alert.     BP 132/74   Pulse 71   Ht 4\' 8"  (1.422 m)   Wt 186 lb 0.6 oz (84.4 kg)   SpO2 97%   BMI 41.71 kg/m  Wt Readings from Last 3 Encounters:  06/05/23 186 lb 0.6 oz (84.4 kg)  01/27/23 189 lb 1.9 oz (85.8 kg)  11/06/22 181 lb (82.1 kg)    Lab Results  Component Value Date   TSH 4.370 01/20/2023   Lab Results  Component Value Date   WBC 4.8 01/20/2023   HGB 12.9 01/20/2023   HCT 38.6 01/20/2023   MCV 86 01/20/2023   PLT 268 01/20/2023   Lab Results  Component Value Date   NA 140 01/20/2023   K 4.4 01/20/2023   CO2 24 01/20/2023   GLUCOSE 101 (H) 01/20/2023   BUN 13 01/20/2023   CREATININE 1.15 (H) 01/20/2023   BILITOT 0.3 01/20/2023   ALKPHOS 69 01/20/2023   AST 17 01/20/2023   ALT 14 01/20/2023   PROT 6.1 01/20/2023   ALBUMIN 4.1 01/20/2023   CALCIUM 9.4 01/20/2023   ANIONGAP 7 10/15/2021   EGFR 53 (L) 01/20/2023   Lab Results  Component Value Date   CHOL 191 01/20/2023   Lab Results  Component Value Date   HDL 78 01/20/2023   Lab Results  Component Value Date   LDLCALC 98 01/20/2023   Lab Results  Component Value Date   TRIG 84 01/20/2023   Lab Results  Component Value Date   CHOLHDL 2.4 01/20/2023  Lab Results  Component Value Date   HGBA1C 5.9 (H) 01/20/2023      Assessment & Plan:  Primary hypertension Assessment & Plan: The patient's condition is controlled. She is encouraged to continue taking olmesartan -amlodipine -hydrochlorothiazide 40-10-12.5 mg daily. A low-sodium diet with increased physical activity is recommended. No changes to the medication regimen are needed today. BP Readings from Last 3 Encounters:  06/05/23 132/74  01/27/23 120/71  09/27/22 111/68      Morbid obesity (HCC) Assessment & Plan: The patient reports implementing lifestyle modifications over the  past 3 months with no significant weight loss. Continued lifestyle modifications and treatment options were discussed. Will initiate therapy with phentermine  15 mg daily. Side effects and potential adverse reactions were discussed, and the patient verbalized understanding   Orders: -     Phentermine  HCl; Take 1 capsule (15 mg total) by mouth every morning.  Dispense: 30 capsule; Refill: 0  Vitamin D  deficiency Assessment & Plan: Encouraged to increase his intake of vitamin D -rich foods such as fatty fish (e.g., salmon, mackerel, and sardines), fortified dairy products, egg yolks, and fortified cereals.   Orders: -     VITAMIN D  25 Hydroxy (Vit-D Deficiency, Fractures)  Prediabetes Assessment & Plan: Lifestyle modifications for prediabetes were discussed, including adopting a heart-healthy diet and increasing physical activity. The patient was encouraged to decrease her intake of high-sugar foods and beverages. She verbalized understanding and is aware of the plan of care.    Gastroesophageal reflux disease without esophagitis -     Omeprazole ; Take 1 capsule (20 mg total) by mouth daily.  Dispense: 30 capsule; Refill: 3  IFG (impaired fasting glucose) -     Hemoglobin A1c  TSH (thyroid -stimulating hormone deficiency) -     TSH + free T4  Other hyperlipidemia -     Lipid panel -     CMP14+EGFR -     CBC with Differential/Platelet  Note: This chart has been completed using Engineer, civil (consulting) software, and while attempts have been made to ensure accuracy, certain words and phrases may not be transcribed as intended.    Follow-up: Return in about 5 months (around 11/05/2023).   Damiyah Ditmars, FNP

## 2023-06-05 NOTE — Patient Instructions (Addendum)
 I appreciate the opportunity to provide care to you today!    Follow up:  5 months  Labs: please stop by the lab today to get your blood drawn (CBC, CMP, TSH, Lipid profile, HgA1c, Vit D)    Start taking omeprazole 20 mg daily For managing GERD, I recommend the following lifestyle changes:  Avoid Certain Foods and Drinks: Limit or eliminate coffee, chocolate, onions, peppermint, spicy foods, carbonated beverages, citrus fruits, tomatoes, garlic, alcohol, and fatty foods such as bacon, burgers, sausages, steak, fried foods, and dairy products.  Recommended Foods: Increase your intake of high-fiber foods including whole grain cereals, oatmeal, brown rice, root vegetables, and non-citrus fruits. Opt for high-protein foods and healthy fats such as avocados, olive oil, nuts, and seeds.   Start taking phentermine 15 mg daily. For optimal results with weight loss, I recommend:  Decreasing portion sizes. Reducing sugar, sodium, and carbohydrate intake, and limiting saturated fats in your diet. Increasing your fiber intake by incorporating more whole grains, fruits, and vegetables. Setting healthy goals and focusing on lowering carbs, sugar, and fat. Increasing the variety of fruits and vegetables in your diet. Reducing soda consumption and limiting processed foods. In addition to taking your weight loss medication, engage in moderate-intensity physical activity for at least 150 minutes per week for the best results.  Please stop by your local pharmacy and get your Tdap and Shingles vaccine    Please continue to a heart-healthy diet and increase your physical activities. Try to exercise for at least five days a week.    It was a pleasure to see you and I look forward to continuing to work together on your health and well-being. Please do not hesitate to call the office if you need care or have questions about your care.  In case of emergency, please visit the Emergency Department for  urgent care, or contact our clinic at [phone] to schedule an appointment. We're here to help you!   Have a wonderful day and week. With Gratitude, Kaoru Rezendes MSN, FNP-BC

## 2023-06-09 NOTE — Assessment & Plan Note (Signed)
 The patient's condition is controlled. She is encouraged to continue taking olmesartan -amlodipine -hydrochlorothiazide 40-10-12.5 mg daily. A low-sodium diet with increased physical activity is recommended. No changes to the medication regimen are needed today. BP Readings from Last 3 Encounters:  06/05/23 132/74  01/27/23 120/71  09/27/22 111/68

## 2023-06-09 NOTE — Assessment & Plan Note (Addendum)
 Lifestyle modifications for prediabetes were discussed, including adopting a heart-healthy diet and increasing physical activity. The patient was encouraged to decrease her intake of high-sugar foods and beverages. She verbalized understanding and is aware of the plan of care.

## 2023-06-09 NOTE — Assessment & Plan Note (Signed)
 The patient reports implementing lifestyle modifications over the past 3 months with no significant weight loss. Continued lifestyle modifications and treatment options were discussed. Will initiate therapy with phentermine  15 mg daily. Side effects and potential adverse reactions were discussed, and the patient verbalized understanding

## 2023-06-09 NOTE — Assessment & Plan Note (Signed)
 Encouraged to increase his intake of vitamin D-rich foods such as fatty fish (e.g., salmon, mackerel, and sardines), fortified dairy products, egg yolks, and fortified cereals.

## 2023-08-13 ENCOUNTER — Other Ambulatory Visit: Payer: Self-pay | Admitting: Family Medicine

## 2023-08-13 DIAGNOSIS — I10 Essential (primary) hypertension: Secondary | ICD-10-CM

## 2023-08-21 ENCOUNTER — Other Ambulatory Visit: Payer: Self-pay | Admitting: Family Medicine

## 2023-08-21 DIAGNOSIS — I10 Essential (primary) hypertension: Secondary | ICD-10-CM

## 2023-10-14 ENCOUNTER — Other Ambulatory Visit: Payer: Self-pay | Admitting: Family Medicine

## 2023-10-14 DIAGNOSIS — K219 Gastro-esophageal reflux disease without esophagitis: Secondary | ICD-10-CM

## 2023-11-03 DIAGNOSIS — R7301 Impaired fasting glucose: Secondary | ICD-10-CM | POA: Diagnosis not present

## 2023-11-03 DIAGNOSIS — E7849 Other hyperlipidemia: Secondary | ICD-10-CM | POA: Diagnosis not present

## 2023-11-03 DIAGNOSIS — E038 Other specified hypothyroidism: Secondary | ICD-10-CM | POA: Diagnosis not present

## 2023-11-03 DIAGNOSIS — E559 Vitamin D deficiency, unspecified: Secondary | ICD-10-CM | POA: Diagnosis not present

## 2023-11-04 LAB — CMP14+EGFR
ALT: 11 IU/L (ref 0–32)
AST: 14 IU/L (ref 0–40)
Albumin: 4 g/dL (ref 3.9–4.9)
Alkaline Phosphatase: 65 IU/L (ref 49–135)
BUN/Creatinine Ratio: 14 (ref 12–28)
BUN: 16 mg/dL (ref 8–27)
Bilirubin Total: <0.2 mg/dL (ref 0.0–1.2)
CO2: 24 mmol/L (ref 20–29)
Calcium: 9.4 mg/dL (ref 8.7–10.3)
Chloride: 102 mmol/L (ref 96–106)
Creatinine, Ser: 1.11 mg/dL — ABNORMAL HIGH (ref 0.57–1.00)
Globulin, Total: 2.1 g/dL (ref 1.5–4.5)
Glucose: 98 mg/dL (ref 70–99)
Potassium: 4.5 mmol/L (ref 3.5–5.2)
Sodium: 139 mmol/L (ref 134–144)
Total Protein: 6.1 g/dL (ref 6.0–8.5)
eGFR: 54 mL/min/1.73 — ABNORMAL LOW (ref >59)

## 2023-11-04 LAB — CBC WITH DIFFERENTIAL/PLATELET
Basophils Absolute: 0.1 x10E3/uL (ref 0.0–0.2)
Basos: 1 %
EOS (ABSOLUTE): 0.4 x10E3/uL (ref 0.0–0.4)
Eos: 8 %
Hematocrit: 35.4 % (ref 34.0–46.6)
Hemoglobin: 11.3 g/dL (ref 11.1–15.9)
Immature Grans (Abs): 0 x10E3/uL (ref 0.0–0.1)
Immature Granulocytes: 0 %
Lymphocytes Absolute: 1.9 x10E3/uL (ref 0.7–3.1)
Lymphs: 35 %
MCH: 28.5 pg (ref 26.6–33.0)
MCHC: 31.9 g/dL (ref 31.5–35.7)
MCV: 89 fL (ref 79–97)
Monocytes Absolute: 0.5 x10E3/uL (ref 0.1–0.9)
Monocytes: 9 %
Neutrophils Absolute: 2.7 x10E3/uL (ref 1.4–7.0)
Neutrophils: 47 %
Platelets: 285 x10E3/uL (ref 150–450)
RBC: 3.96 x10E6/uL (ref 3.77–5.28)
RDW: 12.9 % (ref 11.7–15.4)
WBC: 5.6 x10E3/uL (ref 3.4–10.8)

## 2023-11-04 LAB — LIPID PANEL
Chol/HDL Ratio: 2.4 ratio (ref 0.0–4.4)
Cholesterol, Total: 152 mg/dL (ref 100–199)
HDL: 63 mg/dL (ref >39)
LDL Chol Calc (NIH): 72 mg/dL (ref 0–99)
Triglycerides: 94 mg/dL (ref 0–149)
VLDL Cholesterol Cal: 17 mg/dL (ref 5–40)

## 2023-11-04 LAB — HEMOGLOBIN A1C
Est. average glucose Bld gHb Est-mCnc: 120 mg/dL
Hgb A1c MFr Bld: 5.8 % — ABNORMAL HIGH (ref 4.8–5.6)

## 2023-11-04 LAB — VITAMIN D 25 HYDROXY (VIT D DEFICIENCY, FRACTURES): Vit D, 25-Hydroxy: 48.8 ng/mL (ref 30.0–100.0)

## 2023-11-04 LAB — TSH+FREE T4
Free T4: 1.1 ng/dL (ref 0.82–1.77)
TSH: 5.75 u[IU]/mL — ABNORMAL HIGH (ref 0.450–4.500)

## 2023-11-06 ENCOUNTER — Ambulatory Visit: Payer: Self-pay | Admitting: Family Medicine

## 2023-11-06 ENCOUNTER — Encounter: Payer: Self-pay | Admitting: Family Medicine

## 2023-11-06 ENCOUNTER — Ambulatory Visit: Admitting: Family Medicine

## 2023-11-06 VITALS — BP 110/64 | HR 78 | Resp 18 | Ht <= 58 in | Wt 181.1 lb

## 2023-11-06 DIAGNOSIS — N951 Menopausal and female climacteric states: Secondary | ICD-10-CM

## 2023-11-06 DIAGNOSIS — Z1231 Encounter for screening mammogram for malignant neoplasm of breast: Secondary | ICD-10-CM | POA: Diagnosis not present

## 2023-11-06 DIAGNOSIS — R7303 Prediabetes: Secondary | ICD-10-CM | POA: Diagnosis not present

## 2023-11-06 DIAGNOSIS — E7849 Other hyperlipidemia: Secondary | ICD-10-CM

## 2023-11-06 DIAGNOSIS — Z23 Encounter for immunization: Secondary | ICD-10-CM

## 2023-11-06 DIAGNOSIS — M1612 Unilateral primary osteoarthritis, left hip: Secondary | ICD-10-CM | POA: Diagnosis not present

## 2023-11-06 DIAGNOSIS — F3289 Other specified depressive episodes: Secondary | ICD-10-CM

## 2023-11-06 DIAGNOSIS — I10 Essential (primary) hypertension: Secondary | ICD-10-CM

## 2023-11-06 MED ORDER — BUPROPION HCL ER (XL) 150 MG PO TB24: 150.0000 mg | ORAL_TABLET | Freq: Every day | ORAL | 3 refills | Status: AC

## 2023-11-06 MED ORDER — NAPROXEN 500 MG PO TABS
500.0000 mg | ORAL_TABLET | Freq: Every day | ORAL | 2 refills | Status: AC | PRN
Start: 2023-11-06 — End: ?

## 2023-11-06 MED ORDER — PHENTERMINE HCL 15 MG PO CAPS: 15.0000 mg | ORAL_CAPSULE | ORAL | 0 refills | Status: AC

## 2023-11-06 MED ORDER — OLMESARTAN-AMLODIPINE-HCTZ 40-10-12.5 MG PO TABS: 1.0000 | ORAL_TABLET | Freq: Every day | ORAL | 0 refills | Status: DC

## 2023-11-06 MED ORDER — CITALOPRAM HYDROBROMIDE 20 MG PO TABS: 20.0000 mg | ORAL_TABLET | Freq: Every day | ORAL | 1 refills | Status: AC

## 2023-11-06 NOTE — Assessment & Plan Note (Signed)
 The patient's condition is controlled. She is encouraged to continue taking olmesartan -amlodipine -hydrochlorothiazide 40-10-12.5 mg daily. A low-sodium diet with increased physical activity is recommended. No changes to the medication regimen are needed today. BP Readings from Last 3 Encounters:  11/06/23 110/64  06/05/23 132/74  01/27/23 120/71

## 2023-11-06 NOTE — Assessment & Plan Note (Signed)
 The patient takes pravastatin  40 mg daily. She is encouraged to reduce her intake of cholesterol, trans fats, and saturated fats while increasing her physical activity and consumption of fruits, vegetables, and whole grains. She denies any abdominal pain, nausea, or vomiting, and reports no muscle aches or pain. Lab Results  Component Value Date   CHOL 152 11/03/2023   HDL 63 11/03/2023   LDLCALC 72 11/03/2023   TRIG 94 11/03/2023   CHOLHDL 2.4 11/03/2023

## 2023-11-06 NOTE — Assessment & Plan Note (Signed)
 Lifestyle modifications for prediabetes were discussed, including adopting a heart-healthy diet and increasing physical activity. The patient was encouraged to decrease her intake of high-sugar foods and beverages. She verbalized understanding and is aware of the plan of care.

## 2023-11-06 NOTE — Progress Notes (Signed)
 Established Patient Office Visit  Subjective:  Patient ID: Victoria Graves, female    DOB: 1955-06-08  Age: 68 y.o. MRN: [id number]  CC:  Chief Complaint  Patient presents with   Hypertension    5 month follow up     HPI Victoria Graves is a 68 y.o. female with past medical history of hypertension, hyperlipidemia, depression presents for f/u of  chronic medical conditions.  For the details of today's visit, please refer to the assessment and plan.     Past Medical History:  Diagnosis Date   Allergy    Anxiety    Arthritis    CKD (chronic kidney disease) stage 3, GFR 30-59 ml/min (HCC) 05/08/2021   Depression    GERD (gastroesophageal reflux disease)    Hyperlipidemia    Hypertension    Neuropathy 01/28/2023    Past Surgical History:  Procedure Laterality Date   ABDOMINAL HYSTERECTOMY  1999   fibroid   COLONOSCOPY N/A 08/21/2016   Procedure: COLONOSCOPY;  Surgeon: Harvey Margo CROME, MD;  Location: [facility];  Service: Endoscopy;  Laterality: N/A;  9:30 AM   COLONOSCOPY WITH PROPOFOL  N/A 10/23/2021   Procedure: COLONOSCOPY WITH PROPOFOL ;  Surgeon: Cindie Carlin POUR, DO;  Location: [facility];  Service: Endoscopy;  Laterality: N/A;  8:45 AM  ASA 2   POLYPECTOMY  08/21/2016   Procedure: POLYPECTOMY;  Surgeon: Harvey Margo CROME, MD;  Location: [facility];  Service: Endoscopy;;  ascending colon and sigmoid x2   POLYPECTOMY  10/23/2021   Procedure: POLYPECTOMY;  Surgeon: Cindie Carlin POUR, DO;  Location: [facility];  Service: Endoscopy;;    Family History  Problem Relation Age of Onset   Hypertension Mother    Arthritis Mother    Stomach cancer Mother    Heart disease Maternal Grandmother    Kidney disease Maternal Grandmother    Breast cancer Neg Hx    Cancer - Lung Neg Hx     Social History   Socioeconomic History   Marital status: Divorced    Spouse name: Not on file   Number of children: 0   Years of education: 10   Highest education level: Not on  file  Occupational History   Occupation: disability    Comment: back and hip  Tobacco Use   Smoking status: Every Day    Current packs/day: 0.10    Average packs/day: 0.1 packs/day for 20.0 years (2.0 ttl pk-yrs)    Types: Cigarettes   Smokeless tobacco: Never   Tobacco comments:    Smokes a pack a week. Smoking since age 40.   Vaping Use   Vaping status: Never Used  Substance and Sexual Activity   Alcohol use: Yes    Comment: occasional   Drug use: No   Sexual activity: Not Currently    Birth control/protection: Surgical    Comment: total hystetrectomy  Other Topics Concern   Not on file  Social History Narrative   Lives with friend and cousin who has mental disability   Social Drivers of Corporate investment banker Strain: Low Risk  (11/06/2022)   Overall Financial Resource Strain (CARDIA)    Difficulty of Paying Living Expenses: Not hard at all  Food Insecurity: No Food Insecurity (11/06/2022)   Hunger Vital Sign    Worried About Running Out of Food in the Last Year: Never true    Ran Out of Food in the Last Year: Never true  Transportation Needs: No Transportation Needs (  11/06/2022)   PRAPARE - Administrator, Civil Service (Medical): No    Lack of Transportation (Non-Medical): No  Physical Activity: Inactive (11/06/2022)   Exercise Vital Sign    Days of Exercise per Week: 0 days    Minutes of Exercise per Session: 0 min  Stress: No Stress Concern Present (11/06/2022)   [employer] of Occupational Health - Occupational Stress Questionnaire    Feeling of Stress : Not at all  Social Connections: Moderately Isolated (11/06/2022)   Social Connection and Isolation Panel    Frequency of Communication with Friends and Family: More than three times a week    Frequency of Social Gatherings with Friends and Family: More than three times a week    Attends Religious Services: 1 to 4 times per year    Active Member of [employer] or Organizations: No    Attends Tax inspector Meetings: Never    Marital Status: Divorced  Catering manager Violence: Not At Risk (11/06/2022)   Humiliation, Afraid, Rape, and Kick questionnaire    Fear of Current or Ex-Partner: No    Emotionally Abused: No    Physically Abused: No    Sexually Abused: No    Outpatient Medications Prior to Visit  Medication Sig Dispense Refill   acetaminophen  (TYLENOL ) 500 MG tablet Take 1,000 mg by mouth daily.     aspirin EC 81 MG tablet Take 81 mg by mouth daily.     Biotin w/ Vitamins C & E (HAIR/SKIN/NAILS PO) Take 2 tablets by mouth daily. 2.500 mcg     CALCIUM PO Take 600 mg by mouth in the morning.     Cholecalciferol (VITAMIN D ) 2000 units CAPS Take 2,000 Units by mouth in the morning.     Cyanocobalamin (VITAMIN B 12 PO) Take 5,000 Units by mouth daily. Once a day     cycloSPORINE (RESTASIS) 0.05 % ophthalmic emulsion Place 2 drops into both eyes daily.     fluticasone (FLONASE) 50 MCG/ACT nasal spray Place 2 sprays into both nostrils daily as needed for allergies.     hydrOXYzine  (VISTARIL ) 25 MG capsule Take 1 capsule (25 mg total) by mouth at bedtime. 30 capsule 0   Metamucil Fiber CHEW Chew 15 mg by mouth daily. 3 gummies 5 mg each     Multiple Vitamins-Minerals (WOMENS MULTI GUMMIES PO) Take 2 tablets by mouth daily at 2 am. Vitafusion 2 gummies     omeprazole  (PRILOSEC) 20 MG capsule Take 1 capsule by mouth once daily 90 capsule 0   pravastatin  (PRAVACHOL ) 40 MG tablet Take 1 tablet (40 mg total) by mouth daily. 90 tablet 3   pyridOXINE (VITAMIN B6) 50 MG tablet Take 1 tablet (50 mg total) by mouth daily. 30 tablet 1   triamcinolone  cream (KENALOG ) 0.1 % Apply 1 Application topically 2 (two) times daily. 45 g 0   buPROPion  (WELLBUTRIN  XL) 150 MG 24 hr tablet Take 1 tablet (150 mg total) by mouth daily. 90 tablet 3   citalopram  (CELEXA ) 20 MG tablet Take 1 tablet (20 mg total) by mouth daily. 90 tablet 0   Fluoxetine  HCl, PMDD, 20 MG TABS Take 20 mg by mouth.     naproxen   (NAPROSYN ) 500 MG tablet Take 500 mg by mouth daily as needed for mild pain or moderate pain.     Olmesartan -amLODIPine -HCTZ 40-10-12.5 MG TABS Take 1 tablet by mouth once daily 90 tablet 0   docusate sodium (COLACE) 100 MG capsule Take 100 mg by  mouth daily. (Patient not taking: Reported on 11/06/2023)     naproxen  (NAPROSYN ) 500 MG tablet Take 500 mg by mouth 2 (two) times daily with a meal. (Patient not taking: Reported on 11/06/2023)     phentermine  15 MG capsule Take 1 capsule (15 mg total) by mouth every morning. (Patient not taking: Reported on 11/06/2023) 30 capsule 0   No facility-administered medications prior to visit.    Allergies  Allergen Reactions   Lisinopril Cough   Penicillins Rash and Dermatitis    Has patient had a PCN reaction causing immediate rash, facial/tongue/throat swelling, SOB or lightheadedness with hypotension: Yes  Has patient had a PCN reaction causing severe rash involving mucus membranes or skin necrosis: No  Has patient had a PCN reaction that required hospitalization: No  Has patient had a PCN reaction occurring within the last 10 years: No  If all of the above answers are NO, then may proceed with Cephalosporin use.  Has patient had a PCN reaction causing immediate rash, facial/tongue/throat swelling, SOB or lightheadedness with hypotension: Yes, Has patient had a PCN reaction causing severe rash involving mucus membranes or skin necrosis: No, Has patient had a PCN reaction that required hospitalization: No, Has patient had a PCN reaction occurring within the last 10 years: No, If all of the above answers are NO, then may proceed with Cephalosporin use.    ROS Review of Systems  Constitutional:  Negative for chills and fever.  Eyes:  Negative for visual disturbance.  Respiratory:  Negative for chest tightness and shortness of breath.   Neurological:  Negative for dizziness and headaches.      Objective:    Physical Exam HENT:     Head:  Normocephalic.     Mouth/Throat:     Mouth: Mucous membranes are moist.  Cardiovascular:     Rate and Rhythm: Normal rate.     Heart sounds: Normal heart sounds.  Pulmonary:     Effort: Pulmonary effort is normal.     Breath sounds: Normal breath sounds.  Neurological:     Mental Status: She is alert.     BP 110/64   Pulse 78   Resp 18   Ht 4' 10 (1.473 m)   Wt 181 lb 1.3 oz (82.1 kg)   SpO2 96%   BMI 37.85 kg/m  Wt Readings from Last 3 Encounters:  11/06/23 181 lb 1.3 oz (82.1 kg)  06/05/23 186 lb 0.6 oz (84.4 kg)  01/27/23 189 lb 1.9 oz (85.8 kg)    Lab Results  Component Value Date   TSH 5.750 (H) 11/03/2023   Lab Results  Component Value Date   WBC 5.6 11/03/2023   HGB 11.3 11/03/2023   HCT 35.4 11/03/2023   MCV 89 11/03/2023   PLT 285 11/03/2023   Lab Results  Component Value Date   NA 139 11/03/2023   K 4.5 11/03/2023   CO2 24 11/03/2023   GLUCOSE 98 11/03/2023   BUN 16 11/03/2023   CREATININE 1.11 (H) 11/03/2023   BILITOT <0.2 11/03/2023   ALKPHOS 65 11/03/2023   AST 14 11/03/2023   ALT 11 11/03/2023   PROT 6.1 11/03/2023   ALBUMIN 4.0 11/03/2023   CALCIUM 9.4 11/03/2023   ANIONGAP 7 10/15/2021   EGFR 54 (L) 11/03/2023   Lab Results  Component Value Date   CHOL 152 11/03/2023   Lab Results  Component Value Date   HDL 63 11/03/2023   Lab Results  Component Value Date  LDLCALC 72 11/03/2023   Lab Results  Component Value Date   TRIG 94 11/03/2023   Lab Results  Component Value Date   CHOLHDL 2.4 11/03/2023   Lab Results  Component Value Date   HGBA1C 5.8 (H) 11/03/2023      Assessment & Plan:  Primary hypertension Assessment & Plan: The patient's condition is controlled. She is encouraged to continue taking olmesartan -amlodipine -hydrochlorothiazide 40-10-12.5 mg daily. A low-sodium diet with increased physical activity is recommended. No changes to the medication regimen are needed today. BP Readings from Last 3  Encounters:  11/06/23 110/64  06/05/23 132/74  01/27/23 120/71     Orders: -     Olmesartan -amLODIPine -HCTZ; Take 1 tablet by mouth daily.  Dispense: 90 tablet; Refill: 0  Prediabetes Assessment & Plan: Lifestyle modifications for prediabetes were discussed, including adopting a heart-healthy diet and increasing physical activity. The patient was encouraged to decrease her intake of high-sugar foods and beverages. She verbalized understanding and is aware of the plan of care.    Other hyperlipidemia Assessment & Plan: The patient takes pravastatin  40 mg daily. She is encouraged to reduce her intake of cholesterol, trans fats, and saturated fats while increasing her physical activity and consumption of fruits, vegetables, and whole grains. She denies any abdominal pain, nausea, or vomiting, and reports no muscle aches or pain. Lab Results  Component Value Date   CHOL 152 11/03/2023   HDL 63 11/03/2023   LDLCALC 72 11/03/2023   TRIG 94 11/03/2023   CHOLHDL 2.4 11/03/2023      Encounter for immunization Assessment & Plan: Patient educated on CDC recommendation for the vaccine. Verbal consent was obtained from the patient, vaccine administered by nurse, no sign of adverse reactions noted at this time. Patient education on arm soreness and use of tylenol  or ibuprofen  for this patient  was discussed. Patient educated on the signs and symptoms of adverse effect and advise to contact the office if they occur.   Orders: -     Flu vaccine HIGH DOSE PF(Fluzone Trivalent)  Morbid obesity (HCC) Assessment & Plan: Start taking phentermine  15 mg daily. For optimal results with weight loss, I recommend:  Decreasing portion sizes. Reducing sugar, sodium, and carbohydrate intake, and limiting saturated fats in your diet. Increasing your fiber intake by incorporating more whole grains, fruits, and vegetables. Setting healthy goals and focusing on lowering carbs, sugar, and fat. Increasing the  variety of fruits and vegetables in your diet. Reducing soda consumption and limiting processed foods. In addition to taking your weight loss medication, engage in moderate-intensity physical activity for at least 150 minutes per week for the best results.   Orders: -     Phentermine  HCl; Take 1 capsule (15 mg total) by mouth every morning.  Dispense: 30 capsule; Refill: 0  Menopausal vasomotor syndrome -     Citalopram  Hydrobromide; Take 1 tablet (20 mg total) by mouth daily.  Dispense: 90 tablet; Refill: 1  Breast cancer screening by mammogram -     3D Screening Mammogram, Left and Right  Primary osteoarthritis of left hip -     Naproxen ; Take 1 tablet (500 mg total) by mouth daily as needed for mild pain (pain score 1-3) or moderate pain (pain score 4-6).  Dispense: 30 tablet; Refill: 2  Other depression -     buPROPion  HCl ER (XL); Take 1 tablet (150 mg total) by mouth daily.  Dispense: 90 tablet; Refill: 3  Note: This chart has been completed using Advertising account planner  Dictation software, and while attempts have been made to ensure accuracy, certain words and phrases may not be transcribed as intended.    Follow-up: Return in about 1 month (around 12/07/2023) for obesity.   Paula Busenbark  Z Bacchus, FNP

## 2023-11-06 NOTE — Assessment & Plan Note (Signed)
 Patient educated on CDC recommendation for the vaccine. Verbal consent was obtained from the patient, vaccine administered by nurse, no sign of adverse reactions noted at this time. Patient education on arm soreness and use of tylenol or ibuprofen for this patient  was discussed. Patient educated on the signs and symptoms of adverse effect and advise to contact the office if they occur.

## 2023-11-06 NOTE — Assessment & Plan Note (Signed)
 Start taking phentermine  15 mg daily. For optimal results with weight loss, I recommend:  Decreasing portion sizes. Reducing sugar, sodium, and carbohydrate intake, and limiting saturated fats in your diet. Increasing your fiber intake by incorporating more whole grains, fruits, and vegetables. Setting healthy goals and focusing on lowering carbs, sugar, and fat. Increasing the variety of fruits and vegetables in your diet. Reducing soda consumption and limiting processed foods. In addition to taking your weight loss medication, engage in moderate-intensity physical activity for at least 150 minutes per week for the best results.

## 2023-11-06 NOTE — Patient Instructions (Addendum)
 I appreciate the opportunity to provide care to you today!    Follow up:  1 months weight loss  Labs: next visit  Schedule mammogram and medicare annual wellness visit  For a Healthier YOU, I Recommend: Reducing your intake of sugar, sodium, carbohydrates, and saturated fats. Increasing your fiber intake by incorporating more whole grains, fruits, and vegetables into your meals. Setting healthy goals with a focus on lowering your consumption of carbs, sugar, and unhealthy fats. Adding variety to your diet by including a wide range of fruits and vegetables. Cutting back on soda and limiting processed foods as much as possible. Staying active: In addition to taking your weight loss medication, aim for at least 150 minutes of moderate-intensity physical activity each week for optimal results.    Please follow up if your symptoms worsen or fail to improve.     Please continue to a heart-healthy diet and increase your physical activities. Try to exercise for at least five days a week.    It was a pleasure to see you and I look forward to continuing to work together on your health and well-being. Please do not hesitate to call the office if you need care or have questions about your care.  In case of emergency, please visit the Emergency Department for urgent care, or contact our clinic at [phone] to schedule an appointment. We're here to help you!   Have a wonderful day and week. With Gratitude, Meade JENEANE Gerlach MSN, FNP-BC, PMHNP-BC

## 2023-11-11 ENCOUNTER — Ambulatory Visit: Payer: 59

## 2023-11-11 VITALS — Ht <= 58 in | Wt 181.0 lb

## 2023-11-11 DIAGNOSIS — Z0000 Encounter for general adult medical examination without abnormal findings: Secondary | ICD-10-CM

## 2023-11-11 NOTE — Progress Notes (Signed)
 Please attest and cosign this visit due to patients primary care provider not being immediately available at the time the visit was completed.   Subjective:   Victoria Graves is a 68 y.o. who presents for a Medicare Wellness preventive visit. As a reminder, Annual Wellness Visits don't include a physical exam, and some assessments may be limited, especially if this visit is performed virtually. We may recommend an in-person follow-up visit with your provider if needed.  Visit Complete: Virtual I connected with  Victoria Graves on 11/11/23 by a audio enabled telemedicine application and verified that I am speaking with the correct person using two identifiers.  Patient Location: Home  Provider Location: Office/Clinic  I discussed the limitations of evaluation and management by telemedicine. The patient expressed understanding and agreed to proceed.  Vital Signs: Because this visit was a virtual/telehealth visit, some criteria may be missing or patient reported. Any vitals not documented were not able to be obtained and vitals that have been documented are patient reported. VideoDeclined- This patient declined Librarian, academic. Therefore the visit was completed with audio only.  Persons Participating in Visit: Patient.  AWV Questionnaire: No: Patient Medicare AWV questionnaire was not completed prior to this visit. Cardiac Risk Factors include: advanced age (>38men, >63 women);dyslipidemia;hypertension;obesity (BMI >30kg/m2);smoking/ tobacco exposure;sedentary lifestyle     Objective:    Today's Vitals   11/11/23 1045  Weight: 181 lb (82.1 kg)  Height: 4' 10 (1.473 m)  PainSc: 0-No pain   Body mass index is 37.83 kg/m.    11/11/2023   10:42 AM 11/06/2022    1:02 PM 10/23/2021    7:50 AM 10/18/2021    3:13 PM 10/15/2021   10:43 AM 07/26/2021    2:54 PM 08/21/2016    8:45 AM  Advanced Directives  Does Patient Have a Medical Advance Directive? No No  No No No No No   Would patient like information on creating a medical advance directive? Yes (MAU/Ambulatory/Procedural Areas - Information given) No - Patient declined No - Patient declined No - Patient declined No - Patient declined Yes (MAU/Ambulatory/Procedural Areas - Information given)      Data saved with a previous flowsheet row definition    Current Medications (verified) Outpatient Encounter Medications as of 11/11/2023  Medication Sig   acetaminophen  (TYLENOL ) 500 MG tablet Take 1,000 mg by mouth daily.   aspirin EC 81 MG tablet Take 81 mg by mouth daily.   Biotin w/ Vitamins C & E (HAIR/SKIN/NAILS PO) Take 2 tablets by mouth daily. 2.500 mcg   buPROPion  (WELLBUTRIN  XL) 150 MG 24 hr tablet Take 1 tablet (150 mg total) by mouth daily.   CALCIUM PO Take 600 mg by mouth in the morning.   Cholecalciferol (VITAMIN D ) 2000 units CAPS Take 2,000 Units by mouth in the morning.   citalopram  (CELEXA ) 20 MG tablet Take 1 tablet (20 mg total) by mouth daily.   Cyanocobalamin (VITAMIN B 12 PO) Take 5,000 Units by mouth daily. Once a day   cycloSPORINE (RESTASIS) 0.05 % ophthalmic emulsion Place 2 drops into both eyes daily.   docusate sodium (COLACE) 100 MG capsule Take 100 mg by mouth daily.   fluticasone (FLONASE) 50 MCG/ACT nasal spray Place 2 sprays into both nostrils daily as needed for allergies.   hydrOXYzine  (VISTARIL ) 25 MG capsule Take 1 capsule (25 mg total) by mouth at bedtime.   Metamucil Fiber CHEW Chew 15 mg by mouth daily. 3 gummies 5 mg each  Multiple Vitamins-Minerals (WOMENS MULTI GUMMIES PO) Take 2 tablets by mouth daily at 2 am. Vitafusion 2 gummies   naproxen  (NAPROSYN ) 500 MG tablet Take 1 tablet (500 mg total) by mouth daily as needed for mild pain (pain score 1-3) or moderate pain (pain score 4-6).   Olmesartan -amLODIPine -HCTZ 40-10-12.5 MG TABS Take 1 tablet by mouth daily.   omeprazole  (PRILOSEC) 20 MG capsule Take 1 capsule by mouth once daily   phentermine  15 MG  capsule Take 1 capsule (15 mg total) by mouth every morning.   pravastatin  (PRAVACHOL ) 40 MG tablet Take 1 tablet (40 mg total) by mouth daily.   pyridOXINE (VITAMIN B6) 50 MG tablet Take 1 tablet (50 mg total) by mouth daily.   triamcinolone  cream (KENALOG ) 0.1 % Apply 1 Application topically 2 (two) times daily.   [DISCONTINUED] naproxen  (NAPROSYN ) 500 MG tablet Take 500 mg by mouth 2 (two) times daily with a meal. (Patient not taking: Reported on 11/11/2023)   No facility-administered encounter medications on file as of 11/11/2023.    Allergies (verified) Lisinopril and Penicillins   History: Past Medical History:  Diagnosis Date   Allergy    Anxiety    Arthritis    CKD (chronic kidney disease) stage 3, GFR 30-59 ml/min (HCC) 05/08/2021   Depression    GERD (gastroesophageal reflux disease)    Hyperlipidemia    Hypertension    Neuropathy 01/28/2023   Past Surgical History:  Procedure Laterality Date   ABDOMINAL HYSTERECTOMY  1999   fibroid   COLONOSCOPY N/A 08/21/2016   Procedure: COLONOSCOPY;  Surgeon: Harvey Margo CROME, MD;  Location: [facility];  Service: Endoscopy;  Laterality: N/A;  9:30 AM   COLONOSCOPY WITH PROPOFOL  N/A 10/23/2021   Procedure: COLONOSCOPY WITH PROPOFOL ;  Surgeon: Cindie Carlin POUR, DO;  Location: [facility];  Service: Endoscopy;  Laterality: N/A;  8:45 AM  ASA 2   POLYPECTOMY  08/21/2016   Procedure: POLYPECTOMY;  Surgeon: Harvey Margo CROME, MD;  Location: [facility];  Service: Endoscopy;;  ascending colon and sigmoid x2   POLYPECTOMY  10/23/2021   Procedure: POLYPECTOMY;  Surgeon: Cindie Carlin POUR, DO;  Location: [facility];  Service: Endoscopy;;   Family History  Problem Relation Age of Onset   Hypertension Mother    Arthritis Mother    Stomach cancer Mother    Heart disease Maternal Grandmother    Kidney disease Maternal Grandmother    Breast cancer Neg Hx    Cancer - Lung Neg Hx    Social History   Socioeconomic History   Marital  status: Divorced    Spouse name: Not on file   Number of children: 0   Years of education: 10   Highest education level: Not on file  Occupational History   Occupation: disability    Comment: back and hip  Tobacco Use   Smoking status: Every Day    Current packs/day: 0.10    Average packs/day: 0.1 packs/day for 50.8 years (5.1 ttl pk-yrs)    Types: Cigarettes    Start date: 109   Smokeless tobacco: Never   Tobacco comments:    Smokes a pack a week. Smoking since age 46.   Vaping Use   Vaping status: Never Used  Substance and Sexual Activity   Alcohol use: Yes    Comment: occasional   Drug use: No   Sexual activity: Not Currently    Birth control/protection: Surgical    Comment: total hystetrectomy  Other Topics Concern  Not on file  Social History Narrative   Lives with friend and cousin who has mental disability   Social Drivers of Corporate investment banker Strain: Low Risk  (11/11/2023)   Overall Financial Resource Strain (CARDIA)    Difficulty of Paying Living Expenses: Not hard at all  Food Insecurity: No Food Insecurity (11/11/2023)   Hunger Vital Sign    Worried About Running Out of Food in the Last Year: Never true    Ran Out of Food in the Last Year: Never true  Transportation Needs: No Transportation Needs (11/11/2023)   PRAPARE - Administrator, Civil Service (Medical): No    Lack of Transportation (Non-Medical): No  Physical Activity: Inactive (11/11/2023)   Exercise Vital Sign    Days of Exercise per Week: 0 days    Minutes of Exercise per Session: 0 min  Stress: No Stress Concern Present (11/11/2023)   [employer] of Occupational Health - Occupational Stress Questionnaire    Feeling of Stress: Only a little  Social Connections: Socially Isolated (11/11/2023)   Social Connection and Isolation Panel    Frequency of Communication with Friends and Family: More than three times a week    Frequency of Social Gatherings with Friends and  Family: Twice a week    Attends Religious Services: Never    Database administrator or Organizations: No    Attends Engineer, structural: Never    Marital Status: Divorced    Tobacco Counseling Ready to quit: No Counseling given: Yes Tobacco comments: Smokes a pack a week. Smoking since age 41.    Clinical Intake: Pre-visit preparation completed: Yes Pain : No/denies pain Pain Score: 0-No pain   BMI - recorded: 37.83 Nutritional Status: BMI > 30  Obese Nutritional Risks: None Diabetes: No Lab Results  Component Value Date   HGBA1C 5.8 (H) 11/03/2023   HGBA1C 5.9 (H) 01/20/2023   HGBA1C 5.8 (H) 06/18/2022    How often do you need to have someone help you when you read instructions, pamphlets, or other written materials from your doctor or pharmacy?: 1 - Never Interpreter Needed?: No Information entered by :: Dionicia Cerritos W CMA (AAMA)  Activities of Daily Living     11/11/2023   10:52 AM  In your present state of health, do you have any difficulty performing the following activities:  Hearing? 0  Vision? 0  Difficulty concentrating or making decisions? 0  Walking or climbing stairs? 1  Comment due to chronic low back pain  Dressing or bathing? 0  Doing errands, shopping? 0  Preparing Food and eating ? N  Using the Toilet? N  In the past six months, have you accidently leaked urine? N  Do you have problems with loss of bowel control? N  Managing your Medications? N  Managing your Finances? N  Housekeeping or managing your Housekeeping? N   Patient Care Team: Bacchus, Meade PEDLAR, FNP as PCP - General (Family Medicine) Darroll Anes, DO (Optometry)  I have updated your Care Teams any recent Medical Services you may have received from other providers in the past year.     Assessment:   This is a routine wellness examination for [place].  Hearing/Vision screen Hearing Screening - Comments:: Patient denies any hearing difficulties.   Vision Screening - Comments::  Patient wears reading glasses only. Up to date with yearly exams.  Anes Darroll at [facility] [place] location  Goals Addressed  This Visit's Progress     I want to find somewhere else to live. (pt-stated)        [place] and it's hard to back out safely        Depression Screen     11/11/2023   10:54 AM 11/06/2023    4:28 PM 06/05/2023    3:31 PM 01/27/2023    2:47 PM 11/06/2022    1:09 PM 09/27/2022    2:41 PM 09/27/2022    2:32 PM  PHQ 2/9 Scores  PHQ - 2 Score 0 6 3 0 0 4 0  PHQ- 9 Score 7 21 15  0 0 15 0     Fall Risk     11/11/2023   10:50 AM 11/06/2023    4:28 PM 06/05/2023    3:31 PM 01/27/2023    2:47 PM 11/06/2022    1:21 PM  Fall Risk   Falls in the past year? 0 0 0 0 0  Number falls in past yr: 0 0 0 0 0  Injury with Fall? 0 0 0 0 0  Risk for fall due to : No Fall Risks  No Fall Risks No Fall Risks No Fall Risks  Follow up Falls evaluation completed;Education provided;Falls prevention discussed Falls evaluation completed Falls evaluation completed Falls evaluation completed Falls prevention discussed    MEDICARE RISK AT HOME:  Medicare Risk at Home Any stairs in or around the home?: No If so, are there any without handrails?: No Home free of loose throw rugs in walkways, pet beds, electrical cords, etc?: Yes Adequate lighting in your home to reduce risk of falls?: Yes Life alert?: No Use of a cane, walker or w/c?: No Grab bars in the bathroom?: Yes Shower chair or bench in shower?: Yes Elevated toilet seat or a handicapped toilet?: No  TIMED UP AND GO: Was the test performed?  No  Cognitive Function: 6CIT completed    10/15/2021   10:44 AM  MMSE - Mini Mental State Exam  Not completed: Unable to complete        11/11/2023   10:53 AM 11/06/2022    1:07 PM 10/15/2021   10:44 AM 05/29/2016    3:52 PM  6CIT Screen  What Year? 0 points 0 points 0 points 0 points  What month? 0 points 0 points 0 points 0 points  What  time? 0 points 0 points 0 points 0 points  Count back from 20 0 points 0 points 0 points 0 points  Months in reverse 0 points 0 points 0 points 0 points  Repeat phrase 0 points 10 points 0 points 0 points  Total Score 0 points 10 points 0 points 0 points    Immunizations Immunization History  Administered Date(s) Administered   Fluad Quad(high Dose 65+) 11/21/2021   INFLUENZA, HIGH DOSE SEASONAL PF 11/06/2023   Influenza, Seasonal, Injecte, Preservative Fre 09/27/2022   Influenza,inj,Quad PF,6+ Mos 11/29/2015, 02/06/2017   Influenza-Unspecified 11/28/2020   PFIZER Comirnaty(Gray Top)Covid-19 Tri-Sucrose Vaccine 03/02/2020   PFIZER(Purple Top)SARS-COV-2 Vaccination 03/23/2020   PNEUMOCOCCAL CONJUGATE-20 11/21/2021   Tdap 11/29/2015    Screening Tests Health Maintenance  Topic Date Due   Zoster Vaccines- Shingrix (1 of 2) Never done   Mammogram  08/12/2023   Medicare Annual Wellness (AWV)  11/10/2024   DEXA SCAN  02/05/2025   DTaP/Tdap/Td (2 - Td or Tdap) 11/28/2025   Colonoscopy  10/24/2026   Pneumococcal Vaccine: 50+ Years  Completed   Influenza Vaccine  Completed   Hepatitis C Screening  Completed   Meningococcal B Vaccine  Aged Out   COVID-19 Vaccine  Discontinued    Health Maintenance Health Maintenance Due  Topic Date Due   Zoster Vaccines- Shingrix (1 of 2) Never done   Mammogram  08/12/2023   Health Maintenance Items Addressed: Mammogram previously ordered by pcp. Patient provided with phone number to call and schedule screening.  Additional Screening: Vision Screening: Recommended annual ophthalmology exams for early detection of glaucoma and other disorders of the eye. Would you like a referral to an eye doctor? No    Dental Screening: Recommended annual dental exams for proper oral hygiene  Community Resource Referral / Chronic Care Management: CRR required this visit?  No   CCM required this visit?  No  Plan:   I have personally reviewed and noted  the following in the patient's chart:   Medical and social history Use of alcohol, tobacco or illicit drugs  Current medications and supplements including opioid prescriptions. Patient is not currently taking opioid prescriptions. Functional ability and status Nutritional status Physical activity Advanced directives List of other physicians Hospitalizations, surgeries, and ER visits in previous 12 months Vitals Screenings to include cognitive, depression, and falls Referrals and appointments  In addition, I have reviewed and discussed with patient certain preventive protocols, quality metrics, and best practice recommendations. A written personalized care plan for preventive services as well as general preventive health recommendations were provided to patient.   Vennie Waymire, CMA   11/11/2023   After Visit Summary: (Mail) Due to this being a telephonic visit, the after visit summary with patients personalized plan was offered to patient via mail   Notes: Nothing significant to report at this time.

## 2023-11-11 NOTE — Patient Instructions (Signed)
 [name],  Thank you for taking the time for your Medicare Wellness Visit. I appreciate your continued commitment to your health goals. Please review the care plan we discussed, and feel free to reach out if I can assist you further.  Medicare recommends these wellness visits once per year to help you and your care team stay ahead of potential health issues. These visits are designed to focus on prevention, allowing your provider to concentrate on managing your acute and chronic conditions during your regular appointments.  Please note that Annual Wellness Visits do not include a physical exam. Some assessments may be limited, especially if the visit was conducted virtually. If needed, we may recommend a separate in-person follow-up with your provider.  Wishing you excellent health and many blessings in the year to come!  -Maliea Grandmaison, CMA  Ongoing Care Seeing your primary care provider every 3 to 6 months helps us  monitor your health and provide consistent, personalized care.   Recommended Screenings:  Health Maintenance  Topic Date Due   Zoster (Shingles) Vaccine (1 of 2) Never done   Breast Cancer Screening  08/12/2023   Medicare Annual Wellness Visit  11/10/2024   DEXA scan (bone density measurement)  02/05/2025   DTaP/Tdap/Td vaccine (2 - Td or Tdap) 11/28/2025   Colon Cancer Screening  10/24/2026   Pneumococcal Vaccine for age over 56  Completed   Flu Shot  Completed   Hepatitis C Screening  Completed   Meningitis B Vaccine  Aged Out   COVID-19 Vaccine  Discontinued       11/11/2023   10:42 AM  Advanced Directives  Does Patient Have a Medical Advance Directive? No  Would patient like information on creating a medical advance directive? Yes (MAU/Ambulatory/Procedural Areas - Information given)   Advance Care Planning is important because it: Ensures you receive medical care that aligns with your values, goals, and preferences. Provides guidance to your family and loved ones,  reducing the emotional burden of decision-making during critical moments.  Vision: Annual vision screenings are recommended for early detection of glaucoma, cataracts, and diabetic retinopathy. These exams can also reveal signs of chronic conditions such as diabetes and high blood pressure.  Dental: Annual dental screenings help detect early signs of oral cancer, gum disease, and other conditions linked to overall health, including heart disease and diabetes.  Please see the attached documents for additional preventive care recommendations.

## 2023-11-26 ENCOUNTER — Ambulatory Visit (HOSPITAL_COMMUNITY)
Admission: RE | Admit: 2023-11-26 | Discharge: 2023-11-26 | Disposition: A | Discharge status: Home / Self Care | Source: Ambulatory Visit | Attending: Family Medicine | Admitting: Family Medicine

## 2023-11-26 DIAGNOSIS — Z1231 Encounter for screening mammogram for malignant neoplasm of breast: Secondary | ICD-10-CM | POA: Insufficient documentation

## 2024-01-21 ENCOUNTER — Other Ambulatory Visit: Payer: Self-pay | Admitting: Family Medicine

## 2024-01-21 DIAGNOSIS — K219 Gastro-esophageal reflux disease without esophagitis: Secondary | ICD-10-CM

## 2024-02-12 ENCOUNTER — Other Ambulatory Visit: Payer: Self-pay | Admitting: Family Medicine

## 2024-02-12 DIAGNOSIS — I10 Essential (primary) hypertension: Secondary | ICD-10-CM

## 2024-03-15 ENCOUNTER — Ambulatory Visit: Admitting: Family Medicine

## 2024-11-15 ENCOUNTER — Ambulatory Visit
